# Patient Record
Sex: Male | Born: 1965 | Race: Black or African American | Hispanic: No | Marital: Married | Smoking: Current every day smoker
Health system: Southern US, Community
[De-identification: ages and names within clinical notes are randomized; demographics above are authoritative.]

## PROBLEM LIST (undated history)

## (undated) DIAGNOSIS — I429 Cardiomyopathy, unspecified: Secondary | ICD-10-CM

## (undated) DIAGNOSIS — R202 Paresthesia of skin: Secondary | ICD-10-CM

## (undated) HISTORY — DX: Cardiomyopathy, unspecified: I42.9

## (undated) HISTORY — DX: Paresthesia of skin: R20.2

---

## 2006-06-17 ENCOUNTER — Emergency Department (HOSPITAL_COMMUNITY): Admission: EM | Admit: 2006-06-17 | Discharge: 2006-06-18 | Payer: Self-pay | Admitting: Emergency Medicine

## 2015-06-02 ENCOUNTER — Emergency Department (HOSPITAL_COMMUNITY): Payer: Self-pay

## 2015-06-02 ENCOUNTER — Emergency Department (HOSPITAL_COMMUNITY)
Admission: EM | Admit: 2015-06-02 | Discharge: 2015-06-02 | Disposition: A | Discharge status: Home / Self Care | Payer: Self-pay | Attending: Emergency Medicine | Admitting: Emergency Medicine

## 2015-06-02 ENCOUNTER — Encounter (HOSPITAL_COMMUNITY): Payer: Self-pay

## 2015-06-02 DIAGNOSIS — R059 Cough, unspecified: Secondary | ICD-10-CM

## 2015-06-02 DIAGNOSIS — J40 Bronchitis, not specified as acute or chronic: Secondary | ICD-10-CM | POA: Insufficient documentation

## 2015-06-02 DIAGNOSIS — F17210 Nicotine dependence, cigarettes, uncomplicated: Secondary | ICD-10-CM | POA: Insufficient documentation

## 2015-06-02 DIAGNOSIS — R05 Cough: Secondary | ICD-10-CM

## 2015-06-02 LAB — BASIC METABOLIC PANEL
Anion gap: 15 (ref 5–15)
BUN: 10 mg/dL (ref 6–20)
CHLORIDE: 104 mmol/L (ref 101–111)
CO2: 19 mmol/L — AB (ref 22–32)
CREATININE: 0.94 mg/dL (ref 0.61–1.24)
Calcium: 9.1 mg/dL (ref 8.9–10.3)
GFR calc Af Amer: >60 mL/min (ref >60)
GFR calc non Af Amer: >60 mL/min (ref >60)
Glucose, Bld: 100 mg/dL — ABNORMAL HIGH (ref 65–99)
Potassium: 3.5 mmol/L (ref 3.5–5.1)
SODIUM: 138 mmol/L (ref 135–145)

## 2015-06-02 LAB — I-STAT TROPONIN, ED: Troponin i, poc: 0 ng/mL (ref 0.00–0.08)

## 2015-06-02 LAB — CBC
HCT: 43.9 % (ref 39.0–52.0)
Hemoglobin: 14.5 g/dL (ref 13.0–17.0)
MCH: 31.7 pg (ref 26.0–34.0)
MCHC: 33 g/dL (ref 30.0–36.0)
MCV: 95.9 fL (ref 78.0–100.0)
PLATELETS: 133 10*3/uL — AB (ref 150–400)
RBC: 4.58 MIL/uL (ref 4.22–5.81)
RDW: 13.8 % (ref 11.5–15.5)
WBC: 5.4 10*3/uL (ref 4.0–10.5)

## 2015-06-02 MED ORDER — ALBUTEROL SULFATE HFA 108 (90 BASE) MCG/ACT IN AERS
2.0000 | INHALATION_SPRAY | Freq: Once | RESPIRATORY_TRACT | Status: AC
  Administered 2015-06-02: 2 via RESPIRATORY_TRACT
  Filled 2015-06-02: qty 6.7

## 2015-06-02 NOTE — ED Provider Notes (Signed)
CSN: [id number]     Arrival date & time 06/02/15  1727 History   First MD Initiated Contact with Patient 06/02/15 2115     Chief Complaint  Patient presents with  . Cough     (Consider location/radiation/quality/duration/timing/severity/associated sxs/prior Treatment) Patient is a 50 y.o. male presenting with cough.  Cough Cough characteristics:  Productive Sputum characteristics:  Clear Severity:  Moderate Onset quality:  Gradual Duration:  3 weeks Timing:  Constant Progression:  Waxing and waning Smoker: yes   Context: upper respiratory infection   Relieved by: sitting up. Worsened by:  Lying down (feels mucus dripping on back of throat) Associated symptoms: rhinorrhea   Associated symptoms: no chest pain, no chills, no diaphoresis, no ear pain, no eye discharge, no fever, no headaches, no rash, no shortness of breath, no sinus congestion and no sore throat     History reviewed. No pertinent past medical history. History reviewed. No pertinent past surgical history. History reviewed. No pertinent family history. Social History  Substance Use Topics  . Smoking status: Current Every Day Smoker -- 3.00 packs/day    Types: Cigarettes  . Smokeless tobacco: None  . Alcohol Use: Yes     Comment: pt is unable to say how much he drinks, drinks daily - several 40's and licqor    Review of Systems  Constitutional: Negative for fever, chills, diaphoresis, appetite change and fatigue.  HENT: Positive for rhinorrhea. Negative for congestion, ear pain, facial swelling, mouth sores and sore throat.   Eyes: Negative for discharge and visual disturbance.  Respiratory: Positive for cough. Negative for chest tightness and shortness of breath.   Cardiovascular: Negative for chest pain and palpitations.  Gastrointestinal: Negative for nausea, vomiting, abdominal pain, diarrhea and blood in stool.  Endocrine: Negative for cold intolerance and heat intolerance.  Genitourinary: Negative for  frequency, decreased urine volume and difficulty urinating.  Musculoskeletal: Negative for back pain and neck stiffness.  Skin: Negative for rash.  Neurological: Negative for dizziness, weakness, light-headedness and headaches.  All other systems reviewed and are negative.     Allergies  Review of patient's allergies indicates no known allergies.  Home Medications   Prior to Admission medications   Not on File   BP 127/82 mmHg  Pulse 78  Temp(Src) 100.1 F (37.8 C) (Oral)  Resp 18  SpO2 97% Physical Exam  Constitutional: He is oriented to person, place, and time. He appears well-nourished. No distress.  HENT:  Head: Normocephalic and atraumatic.  Right Ear: External ear normal.  Left Ear: External ear normal.  Mouth/Throat: No oropharyngeal exudate, posterior oropharyngeal edema or tonsillar abscesses.  Irritation of posterior oropharynx   Eyes: Pupils are equal, round, and reactive to light. Right eye exhibits no discharge. Left eye exhibits no discharge. No scleral icterus.  Neck: Normal range of motion. Neck supple.  Cardiovascular: Normal rate.  Exam reveals no gallop and no friction rub.   No murmur heard. Pulmonary/Chest: Effort normal. No stridor. No respiratory distress. He has wheezes (mild). He has no rales. He exhibits no tenderness.  Abdominal: Soft. He exhibits no distension and no mass. There is no tenderness. There is no rebound and no guarding.  Musculoskeletal: He exhibits no edema or tenderness.  Neurological: He is alert and oriented to person, place, and time.  Skin: Skin is warm and dry. No rash noted. He is not diaphoretic. No erythema.    ED Course  Procedures (including critical care time) Labs Review Labs Reviewed  BASIC METABOLIC PANEL -  Abnormal; Notable for the following:    CO2 19 (*)    Glucose, Bld 100 (*)    All other components within normal limits  CBC - Abnormal; Notable for the following:    Platelets 133 (*)    All other  components within normal limits  I-STAT TROPOININ, ED    Imaging Review Dg Chest 2 View  06/02/2015  CLINICAL DATA:  Three week history of shortness of breath which worsens when supine, productive cough and chest congestion. Intermittent fever, and chills and diaphoresis. EXAM: CHEST  2 VIEW COMPARISON:  None. FINDINGS: Cardiac silhouette moderately enlarged. Hilar and mediastinal contours otherwise unremarkable. Prominent bronchovascular markings diffusely and mild to moderate central peribronchial thickening. No confluent airspace consolidation. No pleural effusions. Pulmonary vascularity normal. Visualized bony thorax intact. IMPRESSION: 1. Mild to moderate changes of bronchitis and/or asthma without focal airspace pneumonia. 2. Cardiomegaly without evidence of pulmonary edema. Electronically Signed   By: Hulan Saas M.D.   On: 06/02/2015 18:40   I have personally reviewed and evaluated these images and lab results as part of my medical decision-making.   EKG Interpretation   Date/Time:  Sunday June 02 2015 17:56:48 EST Ventricular Rate:  85 PR Interval:  186 QRS Duration: 100 QT Interval:  370 QTC Calculation: 440 R Axis:   69 Text Interpretation:  Normal sinus rhythm Cannot rule out Anterior infarct  , age undetermined Abnormal ECG Confirmed by Rubin Payor  MD, Harrold Donath [phone])  on 06/02/2015 9:57:13 PM      MDM   50 year old male presents with 2-3 weeks of URI symptoms. His main complaint is persistent cough when he lies down to go to sleep. He reports that the cough wakes him up intermittently at night because he feels like there is mucus do been on the back of her throat. He denies any fevers or any other physical complaints at this time.  Physical exam consistent with postnasal drip. Chest x-ray with no evidence of pneumonia.   The patient provided with albuterol for wheezing.  Patient safe for discharge with strict return precautions. Patient encouraged to establish  care with a primary care provider for follow-up. Patient also encouraged to quit smoking.   He was seen in conjunction with Dr. Rubin Payor  Final diagnoses:  Bronchitis  Cough        Drema Pry, MD 06/03/15 6045  Benjiman Core, MD 06/03/15 [phone]

## 2015-06-02 NOTE — ED Notes (Addendum)
Pt reports onset 3-4 months of cough, that is worse when he lays flat so he sleeps sitting up, voice hoarse and runny nose.  Also, c/o shortness of breath when laying flat and a tingling in left arm.   Pt has not taken any meds for symptoms or seen a doctor.  Pt able to talk in complete sentences.

## 2015-06-02 NOTE — Discharge Instructions (Signed)
Acute Bronchitis °Bronchitis is inflammation of the airways that extend from the windpipe into the lungs (bronchi). The inflammation often causes mucus to develop. This leads to a cough, which is the most common symptom of bronchitis.  °In acute bronchitis, the condition usually develops suddenly and goes away over time, usually in a couple weeks. Smoking, allergies, and asthma can make bronchitis worse. Repeated episodes of bronchitis may cause further lung problems.  °CAUSES °Acute bronchitis is most often caused by the same virus that causes a cold. The virus can spread from person to person (contagious) through coughing, sneezing, and touching contaminated objects. °SIGNS AND SYMPTOMS  °· Cough.   °· Fever.   °· Coughing up mucus.   °· Body aches.   °· Chest congestion.   °· Chills.   °· Shortness of breath.   °· Sore throat.   °DIAGNOSIS  °Acute bronchitis is usually diagnosed through a physical exam. Your health care provider will also ask you questions about your medical history. Tests, such as chest X-rays, are sometimes done to rule out other conditions.  °TREATMENT  °Acute bronchitis usually goes away in a couple weeks. Oftentimes, no medical treatment is necessary. Medicines are sometimes given for relief of fever or cough. Antibiotic medicines are usually not needed but may be prescribed in certain situations. In some cases, an inhaler may be recommended to help reduce shortness of breath and control the cough. A cool mist vaporizer may also be used to help thin bronchial secretions and make it easier to clear the chest.  °HOME CARE INSTRUCTIONS °· Get plenty of rest.   °· Drink enough fluids to keep your urine clear or pale yellow (unless you have a medical condition that requires fluid restriction). Increasing fluids may help thin your respiratory secretions (sputum) and reduce chest congestion, and it will prevent dehydration.   °· Take medicines only as directed by your health care provider. °· If  you were prescribed an antibiotic medicine, finish it all even if you start to feel better. °· Avoid smoking and secondhand smoke. Exposure to cigarette smoke or irritating chemicals will make bronchitis worse. If you are a smoker, consider using nicotine gum or skin patches to help control withdrawal symptoms. Quitting smoking will help your lungs heal faster.   °· Reduce the chances of another bout of acute bronchitis by washing your hands frequently, avoiding people with cold symptoms, and trying not to touch your hands to your mouth, nose, or eyes.   °· Keep all follow-up visits as directed by your health care provider.   °SEEK MEDICAL CARE IF: °Your symptoms do not improve after 1 week of treatment.  °SEEK IMMEDIATE MEDICAL CARE IF: °· You develop an increased fever or chills.   °· You have chest pain.   °· You have severe shortness of breath. °· You have bloody sputum.   °· You develop dehydration. °· You faint or repeatedly feel like you are going to pass out. °· You develop repeated vomiting. °· You develop a severe headache. °MAKE SURE YOU:  °· Understand these instructions. °· Will watch your condition. °· Will get help right away if you are not doing well or get worse. °  °This information is not intended to replace advice given to you by your health care provider. Make sure you discuss any questions you have with your health care provider. °  °Document Released: 05/14/2004 Document Revised: 04/27/2014 Document Reviewed: 09/27/2012 °Elsevier Interactive Patient Education ©2016 [employer] °Smoking Cessation, Tips for Success °If you are ready to quit smoking, congratulations! You have chosen to help yourself   be healthier. Cigarettes bring nicotine, tar, carbon monoxide, and other irritants into your body. Your lungs, heart, and blood vessels will be able to work better without these poisons. There are many different ways to quit smoking. Nicotine gum, nicotine patches, a nicotine inhaler, or nicotine  nasal spray can help with physical craving. Hypnosis, support groups, and medicines help break the habit of smoking. °WHAT THINGS CAN I DO TO MAKE QUITTING EASIER?  °Here are some tips to help you quit for good: °· Pick a date when you will quit smoking completely. Tell all of your friends and family about your plan to quit on that date. °· Do not try to slowly cut down on the number of cigarettes you are smoking. Pick a quit date and quit smoking completely starting on that day. °· Throw away all cigarettes.   °· Clean and remove all ashtrays from your home, work, and car. °· On a card, write down your reasons for quitting. Carry the card with you and read it when you get the urge to smoke. °· Cleanse your body of nicotine. Drink enough water and fluids to keep your urine clear or pale yellow. Do this after quitting to flush the nicotine from your body. °· Learn to predict your moods. Do not let a bad situation be your excuse to have a cigarette. Some situations in your life might tempt you into wanting a cigarette. °· Never have "just one" cigarette. It leads to wanting another and another. Remind yourself of your decision to quit. °· Change habits associated with smoking. If you smoked while driving or when feeling stressed, try other activities to replace smoking. Stand up when drinking your coffee. Brush your teeth after eating. Sit in a different chair when you read the paper. Avoid alcohol while trying to quit, and try to drink fewer caffeinated beverages. Alcohol and caffeine may urge you to smoke. °· Avoid foods and drinks that can trigger a desire to smoke, such as sugary or spicy foods and alcohol. °· Ask people who smoke not to smoke around you. °· Have something planned to do right after eating or having a cup of coffee. For example, plan to take a walk or exercise. °· Try a relaxation exercise to calm you down and decrease your stress. Remember, you may be tense and nervous for the first 2 weeks after  you quit, but this will pass. °· Find new activities to keep your hands busy. Play with a pen, coin, or rubber band. Doodle or draw things on paper. °· Brush your teeth right after eating. This will help cut down on the craving for the taste of tobacco after meals. You can also try mouthwash.   °· Use oral substitutes in place of cigarettes. Try using lemon drops, carrots, cinnamon sticks, or chewing gum. Keep them handy so they are available when you have the urge to smoke. °· When you have the urge to smoke, try deep breathing. °· Designate your home as a nonsmoking area. °· If you are a heavy smoker, ask your health care provider about a prescription for nicotine chewing gum. It can ease your withdrawal from nicotine. °· Reward yourself. Set aside the cigarette money you save and buy yourself something nice. °· Look for support from others. Join a support group or smoking cessation program. Ask someone at home or at work to help you with your plan to quit smoking. °· Always ask yourself, "Do I need this cigarette or is this just a reflex?"   Tell yourself, "Today, I choose not to smoke," or "I do not want to smoke." You are reminding yourself of your decision to quit. °· Do not replace cigarette smoking with electronic cigarettes (commonly called e-cigarettes). The safety of e-cigarettes is unknown, and some may contain harmful chemicals. °· If you relapse, do not give up! Plan ahead and think about what you will do the next time you get the urge to smoke. °HOW WILL I FEEL WHEN I QUIT SMOKING? °You may have symptoms of withdrawal because your body is used to nicotine (the addictive substance in cigarettes). You may crave cigarettes, be irritable, feel very hungry, cough often, get headaches, or have difficulty concentrating. The withdrawal symptoms are only temporary. They are strongest when you first quit but will go away within 10-14 days. When withdrawal symptoms occur, stay in control. Think about your reasons  for quitting. Remind yourself that these are signs that your body is healing and getting used to being without cigarettes. Remember that withdrawal symptoms are easier to treat than the major diseases that smoking can cause.  °Even after the withdrawal is over, expect periodic urges to smoke. However, these cravings are generally short lived and will go away whether you smoke or not. Do not smoke! °WHAT RESOURCES ARE AVAILABLE TO HELP ME QUIT SMOKING? °Your health care provider can direct you to community resources or hospitals for support, which may include: °· Group support. °· Education. °· Hypnosis. °· Therapy. °  °This information is not intended to replace advice given to you by your health care provider. Make sure you discuss any questions you have with your health care provider. °  °Document Released: 01/03/2004 Document Revised: 04/27/2014 Document Reviewed: 09/22/2012 °Elsevier Interactive Patient Education ©2016 [employer] ° °

## 2015-06-17 ENCOUNTER — Inpatient Hospital Stay: Payer: Self-pay | Admitting: Family Medicine

## 2015-07-30 ENCOUNTER — Encounter (HOSPITAL_COMMUNITY): Payer: Self-pay | Admitting: Emergency Medicine

## 2015-07-30 ENCOUNTER — Emergency Department (HOSPITAL_COMMUNITY): Payer: Self-pay

## 2015-07-30 ENCOUNTER — Observation Stay (HOSPITAL_COMMUNITY)
Admission: EM | Admit: 2015-07-30 | Discharge: 2015-07-31 | Disposition: A | Discharge status: Home / Self Care | Payer: Self-pay | Attending: Internal Medicine | Admitting: Internal Medicine

## 2015-07-30 DIAGNOSIS — Y906 Blood alcohol level of 120-199 mg/100 ml: Secondary | ICD-10-CM | POA: Insufficient documentation

## 2015-07-30 DIAGNOSIS — F1020 Alcohol dependence, uncomplicated: Secondary | ICD-10-CM | POA: Diagnosis present

## 2015-07-30 DIAGNOSIS — F17210 Nicotine dependence, cigarettes, uncomplicated: Secondary | ICD-10-CM | POA: Insufficient documentation

## 2015-07-30 DIAGNOSIS — S0003XA Contusion of scalp, initial encounter: Secondary | ICD-10-CM | POA: Insufficient documentation

## 2015-07-30 DIAGNOSIS — F10929 Alcohol use, unspecified with intoxication, unspecified: Secondary | ICD-10-CM | POA: Diagnosis present

## 2015-07-30 DIAGNOSIS — X58XXXA Exposure to other specified factors, initial encounter: Secondary | ICD-10-CM | POA: Insufficient documentation

## 2015-07-30 DIAGNOSIS — Z79899 Other long term (current) drug therapy: Secondary | ICD-10-CM | POA: Insufficient documentation

## 2015-07-30 DIAGNOSIS — Z720 Tobacco use: Secondary | ICD-10-CM | POA: Diagnosis present

## 2015-07-30 DIAGNOSIS — R55 Syncope and collapse: Principal | ICD-10-CM | POA: Diagnosis present

## 2015-07-30 DIAGNOSIS — Z9181 History of falling: Secondary | ICD-10-CM | POA: Insufficient documentation

## 2015-07-30 DIAGNOSIS — F10129 Alcohol abuse with intoxication, unspecified: Secondary | ICD-10-CM | POA: Insufficient documentation

## 2015-07-30 DIAGNOSIS — Y92008 Other place in unspecified non-institutional (private) residence as the place of occurrence of the external cause: Secondary | ICD-10-CM | POA: Insufficient documentation

## 2015-07-30 LAB — RAPID URINE DRUG SCREEN, HOSP PERFORMED
AMPHETAMINES: NOT DETECTED
BENZODIAZEPINES: NOT DETECTED
Barbiturates: NOT DETECTED
COCAINE: NOT DETECTED
OPIATES: NOT DETECTED
TETRAHYDROCANNABINOL: NOT DETECTED

## 2015-07-30 LAB — CBC
HCT: 42.4 % (ref 39.0–52.0)
Hemoglobin: 14.2 g/dL (ref 13.0–17.0)
MCH: 31.9 pg (ref 26.0–34.0)
MCHC: 33.5 g/dL (ref 30.0–36.0)
MCV: 95.3 fL (ref 78.0–100.0)
PLATELETS: 179 10*3/uL (ref 150–400)
RBC: 4.45 MIL/uL (ref 4.22–5.81)
RDW: 13.3 % (ref 11.5–15.5)
WBC: 8.4 10*3/uL (ref 4.0–10.5)

## 2015-07-30 LAB — URINALYSIS, ROUTINE W REFLEX MICROSCOPIC
Bilirubin Urine: NEGATIVE
GLUCOSE, UA: NEGATIVE mg/dL
KETONES UR: NEGATIVE mg/dL
LEUKOCYTES UA: NEGATIVE
NITRITE: NEGATIVE
PROTEIN: NEGATIVE mg/dL
Specific Gravity, Urine: 1.005 (ref 1.005–1.030)
pH: 5.5 (ref 5.0–8.0)

## 2015-07-30 LAB — BASIC METABOLIC PANEL
Anion gap: 15 (ref 5–15)
BUN: 9 mg/dL (ref 6–20)
CO2: 18 mmol/L — ABNORMAL LOW (ref 22–32)
CREATININE: 0.79 mg/dL (ref 0.61–1.24)
Calcium: 9.1 mg/dL (ref 8.9–10.3)
Chloride: 103 mmol/L (ref 101–111)
GFR calc Af Amer: >60 mL/min (ref >60)
Glucose, Bld: 102 mg/dL — ABNORMAL HIGH (ref 65–99)
Potassium: 3.6 mmol/L (ref 3.5–5.1)
SODIUM: 136 mmol/L (ref 135–145)

## 2015-07-30 LAB — CBG MONITORING, ED
GLUCOSE-CAPILLARY: 109 mg/dL — AB (ref 65–99)
Glucose-Capillary: 98 mg/dL (ref 65–99)

## 2015-07-30 LAB — PROTIME-INR
INR: 1.01 (ref 0.00–1.49)
Prothrombin Time: 13.5 seconds (ref 11.6–15.2)

## 2015-07-30 LAB — HEPATIC FUNCTION PANEL
ALK PHOS: 62 U/L (ref 38–126)
ALT: 17 U/L (ref 17–63)
AST: 23 U/L (ref 15–41)
Albumin: 4.2 g/dL (ref 3.5–5.0)
BILIRUBIN TOTAL: 0.3 mg/dL (ref 0.3–1.2)
Total Protein: 7.7 g/dL (ref 6.5–8.1)

## 2015-07-30 LAB — I-STAT TROPONIN, ED: Troponin i, poc: 0 ng/mL (ref 0.00–0.08)

## 2015-07-30 LAB — URINE MICROSCOPIC-ADD ON

## 2015-07-30 LAB — APTT: aPTT: 26 seconds (ref 24–37)

## 2015-07-30 LAB — ETHANOL: ALCOHOL ETHYL (B): 168 mg/dL — AB (ref <5)

## 2015-07-30 MED ORDER — ACETAMINOPHEN 500 MG PO TABS
1000.0000 mg | ORAL_TABLET | Freq: Once | ORAL | Status: AC
  Administered 2015-07-30: 1000 mg via ORAL
  Filled 2015-07-30: qty 2

## 2015-07-30 NOTE — ED Notes (Signed)
cbg [id number] 

## 2015-07-30 NOTE — ED Notes (Signed)
Pt used restroom but didn't get urine sample

## 2015-07-30 NOTE — Progress Notes (Signed)
EDCM spoke to patient at bedside. Patient confirms he [place] a [place]cp [place]  [facility], informed patient of services there and walk in times.  EDCM also provided patient with list of pcps who accept self pay patients, list of discount pharmacies and websites needymeds.org and GoodRX.com for medication assistance, phone number to inquire about the orange card, phone number to inquire about Medicaid, phone number to inquire about the Affordable Care Act, financial resources in the community such as local churches, [employer], urban ministries, and dental assistance for uninsured patients.  Patient thankful for resources.  No further EDCM needs at this time.  This information was provided to patient's wife at bedside.  Patient's wife reports [facility] but patient did not show up to his appointment.

## 2015-07-30 NOTE — ED Provider Notes (Signed)
CSN: [id number]     Arrival date & time 07/30/15  2138 History   First MD Initiated Contact with Patient 07/30/15 2210     Chief Complaint  Patient presents with  . Near Syncope     (Consider location/radiation/quality/duration/timing/severity/associated sxs/prior Treatment) HPI   Casey Howe is a 50 y.o. male, patient with no known past medical history, presenting to the ED with a syncopal episode that occurred around 2000 this evening. Pt found unresponsive on the front porch for at least 3 minutes. Pt refused transport by EMS. Pt was then convinced to allow his family to bring him to the hospital. States these "blackouts" have been happening randomly over the last couple months. Pt adds that he has had a cough since February, which prevents him from sleeping well. Pt states he just thinks he passed out because he is tired. Patient does not remember losing consciousness, but adamantly states that he had no symptoms prior. Patient complains of pain to the back of his head, tingling in his hands and feet, and burning in his throat. Patient states that he has no medical problems, however, states that it is been years since he has seen a physician. Patient denies fever/chills, nausea/vomiting, chest pain, shortness of breath, neck/back pain, dizziness, or any other complaints. Pt smokes 3-4 packs of cigarettes a day. Patient also endorses daily alcohol intake, but refused to give an amount. Patient's family reports the patient had at least 4-5 alcoholic beverages this evening.  History reviewed. No pertinent past medical history. History reviewed. No pertinent past surgical history. No family history on file. Social History  Substance Use Topics  . Smoking status: Current Every Day Smoker -- 3.00 packs/day    Types: Cigarettes  . Smokeless tobacco: None  . Alcohol Use: Yes     Comment: pt is unable to say how much he drinks, drinks daily - several 40's and licqor    Review of Systems   Constitutional: Negative for fever and chills.  Respiratory: Positive for cough. Negative for shortness of breath.   Cardiovascular: Negative for chest pain.  Gastrointestinal: Negative for nausea, vomiting, abdominal pain and diarrhea.  Musculoskeletal: Negative for back pain, neck pain and neck stiffness.  Skin: Negative for color change and pallor.  Neurological: Positive for syncope and headaches.       Tingling in hands and feet.  All other systems reviewed and are negative.     Allergies  Review of patient's allergies indicates no known allergies.  Home Medications   Prior to Admission medications   Medication Sig Start Date End Date Taking? Authorizing Provider  acetaminophen (TYLENOL) 500 MG tablet Take 1,000 mg by mouth every 6 (six) hours as needed for moderate pain.   Yes Historical Provider, MD  albuterol (PROVENTIL HFA;VENTOLIN HFA) 108 (90 Base) MCG/ACT inhaler Inhale 2 puffs into the lungs every 6 (six) hours as needed for wheezing or shortness of breath.   Yes Historical Provider, MD  Phenyleph-Doxyl-DM-Aspirin (ALKA-SELTZER PLUS NIGHT COLD PO) Take 2 tablets by mouth at bedtime as needed (cold symptoms).   Yes Historical Provider, MD  phenylephrine (SUDAFED PE) 10 MG TABS tablet Take 10 mg by mouth at bedtime as needed (cold symptoms).   Yes Historical Provider, MD   BP 138/81 mmHg  Pulse 73  Temp(Src) 98.6 F (37 C)  Resp 18  SpO2 90% Physical Exam  Constitutional: He is oriented to person, place, and time. He appears well-developed and well-nourished. No distress.  HENT:  Head:  Normocephalic.  Large hematoma on the back of the patient's head. No active hemorrhage.  Eyes: Conjunctivae and EOM are normal. Pupils are equal, round, and reactive to light.  Neck: Normal range of motion. Neck supple.  Cardiovascular: Normal rate, regular rhythm, normal heart sounds and intact distal pulses.   Pulmonary/Chest: Effort normal and breath sounds normal. No respiratory  distress.  Abdominal: Soft. There is no tenderness. There is no guarding.  Musculoskeletal: He exhibits no edema or tenderness.  Full ROM in all extremities and spine. No paraspinal tenderness.   Lymphadenopathy:    He has no cervical adenopathy.  Neurological: He is alert and oriented to person, place, and time. He has normal reflexes.  No sensory deficits. Strength 5/5 in all extremities. No gait disturbance. Coordination intact. Cranial nerves III-XII grossly intact. No facial droop.   Skin: Skin is warm and dry. He is not diaphoretic.  Psychiatric: He has a normal mood and affect. His behavior is normal.  Nursing note and vitals reviewed.   ED Course  Procedures (including critical care time) Labs Review Labs Reviewed  BASIC METABOLIC PANEL - Abnormal; Notable for the following:    CO2 18 (*)    Glucose, Bld 102 (*)    All other components within normal limits  URINALYSIS, ROUTINE W REFLEX MICROSCOPIC (NOT AT [facility]) - Abnormal; Notable for the following:    Hgb urine dipstick SMALL (*)    All other components within normal limits  ETHANOL - Abnormal; Notable for the following:    Alcohol, Ethyl (B) 168 (*)    All other components within normal limits  HEPATIC FUNCTION PANEL - Abnormal; Notable for the following:    Bilirubin, Direct <0.1 (*)    All other components within normal limits  URINE MICROSCOPIC-ADD ON - Abnormal; Notable for the following:    Squamous Epithelial / LPF 0-5 (*)    Bacteria, UA RARE (*)    All other components within normal limits  CBG MONITORING, ED - Abnormal; Notable for the following:    Glucose-Capillary 109 (*)    All other components within normal limits  CBC  APTT  PROTIME-INR  URINE RAPID DRUG SCREEN, HOSP PERFORMED  CBG MONITORING, ED  POCT CBG (FASTING - GLUCOSE)-MANUAL ENTRY  I-STAT TROPOININ, ED    Imaging Review Dg Chest 2 View  07/30/2015  CLINICAL DATA:  Syncope at 20:00 tonight.  Positional chest pain. EXAM: CHEST  2 VIEW  COMPARISON:  None. FINDINGS: Minimal linear opacities in the left base may be atelectatic or scarring. The lungs are otherwise clear. There is no pleural effusion. Hilar and mediastinal contours are unremarkable. Heart size is normal. IMPRESSION: Linear scarring or atelectasis in the left base. Electronically Signed   By: Ellery Plunk M.D.   On: 07/30/2015 22:51   Ct Head Wo Contrast  07/30/2015  CLINICAL DATA:  Acute onset of syncope. Acute onset of generalized weakness and headache. Posterior scalp hematoma. Concern for cervical spine injury. Initial encounter. EXAM: CT HEAD WITHOUT CONTRAST CT CERVICAL SPINE WITHOUT CONTRAST TECHNIQUE: Multidetector CT imaging of the head and cervical spine was performed following the standard protocol without intravenous contrast. Multiplanar CT image reconstructions of the cervical spine were also generated. COMPARISON:  None. FINDINGS: CT HEAD FINDINGS There is no evidence of acute infarction, mass lesion, or intra- or extra-axial hemorrhage on CT. The posterior fossa, including the cerebellum, brainstem and fourth ventricle, is within normal limits. The third and lateral ventricles, and basal ganglia are unremarkable in appearance.  The cerebral hemispheres are symmetric in appearance, with normal gray-white differentiation. No mass effect or midline shift is seen. There is no evidence of fracture; visualized osseous structures are unremarkable in appearance. The visualized portions of the orbits are within normal limits. Mucosal thickening is noted at the right maxillary sinus. The remaining paranasal sinuses and mastoid air cells are well-aerated. Soft tissue swelling is noted at the occiput. CT CERVICAL SPINE FINDINGS There is no evidence of fracture or subluxation. Vertebral bodies demonstrate normal height and alignment. A few small anterior osteophytes are noted along the mid to lower cervical spine. Intervertebral disc spaces are preserved. Prevertebral soft  tissues are within normal limits. The visualized neural foramina are grossly unremarkable. The thyroid gland is unremarkable in appearance. The visualized lung apices are clear. No significant soft tissue abnormalities are seen. IMPRESSION: 1. No evidence of traumatic intracranial injury or fracture. 2. No evidence of fracture or subluxation along the cervical spine. 3. Soft tissue swelling at the right occiput. 4. Mucosal thickening at the right maxillary sinus. Electronically Signed   By: Roanna Raider M.D.   On: 07/30/2015 23:13   Ct Cervical Spine Wo Contrast  07/30/2015  CLINICAL DATA:  Acute onset of syncope. Acute onset of generalized weakness and headache. Posterior scalp hematoma. Concern for cervical spine injury. Initial encounter. EXAM: CT HEAD WITHOUT CONTRAST CT CERVICAL SPINE WITHOUT CONTRAST TECHNIQUE: Multidetector CT imaging of the head and cervical spine was performed following the standard protocol without intravenous contrast. Multiplanar CT image reconstructions of the cervical spine were also generated. COMPARISON:  None. FINDINGS: CT HEAD FINDINGS There is no evidence of acute infarction, mass lesion, or intra- or extra-axial hemorrhage on CT. The posterior fossa, including the cerebellum, brainstem and fourth ventricle, is within normal limits. The third and lateral ventricles, and basal ganglia are unremarkable in appearance. The cerebral hemispheres are symmetric in appearance, with normal gray-white differentiation. No mass effect or midline shift is seen. There is no evidence of fracture; visualized osseous structures are unremarkable in appearance. The visualized portions of the orbits are within normal limits. Mucosal thickening is noted at the right maxillary sinus. The remaining paranasal sinuses and mastoid air cells are well-aerated. Soft tissue swelling is noted at the occiput. CT CERVICAL SPINE FINDINGS There is no evidence of fracture or subluxation. Vertebral bodies  demonstrate normal height and alignment. A few small anterior osteophytes are noted along the mid to lower cervical spine. Intervertebral disc spaces are preserved. Prevertebral soft tissues are within normal limits. The visualized neural foramina are grossly unremarkable. The thyroid gland is unremarkable in appearance. The visualized lung apices are clear. No significant soft tissue abnormalities are seen. IMPRESSION: 1. No evidence of traumatic intracranial injury or fracture. 2. No evidence of fracture or subluxation along the cervical spine. 3. Soft tissue swelling at the right occiput. 4. Mucosal thickening at the right maxillary sinus. Electronically Signed   By: Roanna Raider M.D.   On: 07/30/2015 23:13   I have personally reviewed and evaluated these images and lab results as part of my medical decision-making.   EKG Interpretation None      MDM   Final diagnoses:  Syncope, unspecified syncope type    Casey Howe presents for evaluation following a syncopal episode that occurred at around 8 PM this evening.  Findings and plan of care discussed with Casey Barrette, MD. Dr. Donnald Garre personally evaluated and examined this patient.  This patient's story and presentation is suspicious for possible cardiac event  causing syncope. He has no neuro or functional deficits upon my exam. Patient is nontoxic appearing, afebrile, not tachycardic, not tachypneic, maintains SPO2 of 97% on room air, and is in no apparent distress. Patient has no signs of sepsis or other serious or life-threatening condition. HEART score is 4, indicating moderate risk for a cardiac event. Head and C-spine free from acute abnormalities. No signs of consolidation or other acute abnormalities on chest x-ray. No acute lab abnormalities other than the ethanol level. Patient will need to be admitted for observation and syncope workup.  Filed Vitals:   07/30/15 2146 07/31/15 0016  BP: 127/86 138/81  Pulse: 99 73  Temp:  98.6 F (37 C)   Resp: 20 18  SpO2: 97% 90%     Anselm PancoastShawn C Joy, PA-C 07/31/15 0026  Casey BarretteMarcy Pfeiffer, MD 08/01/15 0010

## 2015-07-30 NOTE — ED Notes (Signed)
Patient presents from home via POV for syncopal episode lasting 3 minutes approximately 2000 this evening. Patient states he will move a certain way and have a sharp pain across the chest. Denies CP currently. C/o generalized weakness and HA with hematoma to posterior head. Denies N/V, lightheadedness, dizziness, double or blurred vision.

## 2015-07-30 NOTE — ED Notes (Signed)
Patient transported to X-ray 

## 2015-07-31 ENCOUNTER — Observation Stay (HOSPITAL_BASED_OUTPATIENT_CLINIC_OR_DEPARTMENT_OTHER): Payer: Self-pay

## 2015-07-31 ENCOUNTER — Encounter (HOSPITAL_COMMUNITY): Payer: Self-pay | Admitting: Family Medicine

## 2015-07-31 DIAGNOSIS — F10929 Alcohol use, unspecified with intoxication, unspecified: Secondary | ICD-10-CM | POA: Diagnosis present

## 2015-07-31 DIAGNOSIS — Z720 Tobacco use: Secondary | ICD-10-CM | POA: Diagnosis present

## 2015-07-31 DIAGNOSIS — F10120 Alcohol abuse with intoxication, uncomplicated: Secondary | ICD-10-CM

## 2015-07-31 DIAGNOSIS — R55 Syncope and collapse: Secondary | ICD-10-CM

## 2015-07-31 DIAGNOSIS — F1020 Alcohol dependence, uncomplicated: Secondary | ICD-10-CM

## 2015-07-31 LAB — TSH: TSH: 0.527 u[IU]/mL (ref 0.350–4.500)

## 2015-07-31 LAB — BRAIN NATRIURETIC PEPTIDE: B Natriuretic Peptide: 11.7 pg/mL (ref 0.0–100.0)

## 2015-07-31 LAB — ECHOCARDIOGRAM COMPLETE
HEIGHTINCHES: 67 in
Weight: 3065.28 oz

## 2015-07-31 LAB — GLUCOSE, CAPILLARY: GLUCOSE-CAPILLARY: 89 mg/dL (ref 65–99)

## 2015-07-31 LAB — TROPONIN I
Troponin I: <0.03 ng/mL (ref <0.031)
Troponin I: <0.03 ng/mL (ref <0.031)

## 2015-07-31 MED ORDER — POTASSIUM CHLORIDE IN NACL 20-0.9 MEQ/L-% IV SOLN
INTRAVENOUS | Status: AC
  Administered 2015-07-31: 01:00:00 via INTRAVENOUS
  Filled 2015-07-31: qty 1000

## 2015-07-31 MED ORDER — POLYETHYLENE GLYCOL 3350 17 G PO PACK: 17.0000 g | PACK | Freq: Every day | ORAL | Status: DC | PRN

## 2015-07-31 MED ORDER — ONDANSETRON HCL 4 MG/2ML IJ SOLN: 4.0000 mg | Freq: Three times a day (TID) | INTRAMUSCULAR | Status: AC | PRN

## 2015-07-31 MED ORDER — SODIUM CHLORIDE 0.9% FLUSH
3.0000 mL | Freq: Two times a day (BID) | INTRAVENOUS | Status: DC
Start: 2015-07-31 — End: 2015-07-31

## 2015-07-31 MED ORDER — LORAZEPAM 1 MG PO TABS
1.0000 mg | ORAL_TABLET | Freq: Four times a day (QID) | ORAL | Status: DC | PRN
  Administered 2015-07-31: 1 mg via ORAL
  Filled 2015-07-31 (×2): qty 1

## 2015-07-31 MED ORDER — ACETAMINOPHEN 325 MG PO TABS: 650.0000 mg | ORAL_TABLET | Freq: Four times a day (QID) | ORAL | Status: DC | PRN

## 2015-07-31 MED ORDER — BENZONATATE 100 MG PO CAPS
100.0000 mg | ORAL_CAPSULE | Freq: Once | ORAL | Status: AC
  Administered 2015-07-31: 100 mg via ORAL
  Filled 2015-07-31: qty 1

## 2015-07-31 MED ORDER — CYCLOBENZAPRINE HCL 10 MG PO TABS
10.0000 mg | ORAL_TABLET | Freq: Three times a day (TID) | ORAL | Status: DC | PRN
  Administered 2015-07-31: 10 mg via ORAL
  Filled 2015-07-31: qty 1

## 2015-07-31 MED ORDER — NICOTINE 14 MG/24HR TD PT24
14.0000 mg | MEDICATED_PATCH | Freq: Every day | TRANSDERMAL | Status: DC
  Administered 2015-07-31: 14 mg via TRANSDERMAL
  Filled 2015-07-31: qty 1

## 2015-07-31 MED ORDER — ACETAMINOPHEN 650 MG RE SUPP: 650.0000 mg | Freq: Four times a day (QID) | RECTAL | Status: DC | PRN

## 2015-07-31 MED ORDER — FOLIC ACID 1 MG PO TABS
1.0000 mg | ORAL_TABLET | Freq: Every day | ORAL | Status: DC
  Administered 2015-07-31: 1 mg via ORAL
  Filled 2015-07-31: qty 1

## 2015-07-31 MED ORDER — OXYCODONE-ACETAMINOPHEN 5-325 MG PO TABS
1.0000 | ORAL_TABLET | ORAL | Status: DC | PRN
  Administered 2015-07-31 (×2): 2 via ORAL
  Filled 2015-07-31 (×2): qty 2

## 2015-07-31 MED ORDER — HYDROCODONE-ACETAMINOPHEN 5-325 MG PO TABS
1.0000 | ORAL_TABLET | ORAL | Status: DC | PRN
  Filled 2015-07-31: qty 2

## 2015-07-31 MED ORDER — ADULT MULTIVITAMIN W/MINERALS CH
1.0000 | ORAL_TABLET | Freq: Every day | ORAL | Status: DC
  Administered 2015-07-31: 1 via ORAL
  Filled 2015-07-31: qty 1

## 2015-07-31 MED ORDER — LORAZEPAM 2 MG/ML IJ SOLN: 1.0000 mg | Freq: Four times a day (QID) | INTRAMUSCULAR | Status: DC | PRN

## 2015-07-31 MED ORDER — THIAMINE HCL 100 MG/ML IJ SOLN
100.0000 mg | Freq: Every day | INTRAMUSCULAR | Status: DC
  Filled 2015-07-31: qty 1

## 2015-07-31 MED ORDER — ENOXAPARIN SODIUM 40 MG/0.4ML ~~LOC~~ SOLN
40.0000 mg | SUBCUTANEOUS | Status: DC
  Administered 2015-07-31: 40 mg via SUBCUTANEOUS
  Filled 2015-07-31: qty 0.4

## 2015-07-31 MED ORDER — VITAMIN B-1 100 MG PO TABS
100.0000 mg | ORAL_TABLET | Freq: Every day | ORAL | Status: DC
  Administered 2015-07-31: 100 mg via ORAL
  Filled 2015-07-31: qty 1

## 2015-07-31 NOTE — Progress Notes (Signed)
*  PRELIMINARY RESULTS* Echocardiogram 2D Echocardiogram has been performed.  Casey Howe, [name] 07/31/2015, 8:56 AM

## 2015-07-31 NOTE — Progress Notes (Signed)
Discharge instructions given. Patient and family had no further questions.

## 2015-07-31 NOTE — Progress Notes (Signed)
Patient c/o bilateral neck/should pain.  Pain had analgesics which did not alleviate pain. PCP on call was notified.

## 2015-07-31 NOTE — H&P (Signed)
[facility] History and Physical  Casey Howe ZOX:[id number] DOB: [birth year] DOA: 07/30/2015  Referring physician: ED physician PCP: No primary care provider on file.  Specialists: None listed   Chief Complaint: Syncope   HPI: Casey Howe is a 50 y.o. male with PMH of chronic alcohol abuse and recurrent syncopal episodes who presents to the ED following a syncopal episode at home. Patient reports that he had been in his usual state of health throughout the day, had at least 5-6 alcoholic beverages this evening, and then woke up on his front porch with his family standing over him. Patient's family is at the bedside and assists with the history. No one saw the patient fall, but reports that he could not have been alone for more than a minute before a relative noted and laying supine on the front porch. It was reportedly ~3 minutes before the patient regained consciousness. There was no apparent incontinence or tongue bite and no convulsions were witnessed. Patient and his family report that similar episodes have occurred 3-4 times over the past 6 months. Some of the episodes have occurred in the absence of alcohol but none have been involved convulsive activity, incontinence, or tongue biting. Patient has a distant relative with epilepsy but denies any family history of sudden cardiac death or hypertrophic cardiomyopathy.  In ED, patient was found to be afebrile, saturating well on room air, and with vital signs stable. He complained of pain at the back of his head where there was a hematoma noted. CT of the head and cervical spine were obtained and, aside from soft tissue swelling at the right occiput, these studies were normal. Chest x-ray was notable for linear scarring at the left base but negative for acute cardiopulmonary disease. EKG features a sinus rhythm with PR interval of 221 ms. Ethanol level returned elevated 268. CMP is largely unremarkable, as is the CBC. INR is normal and  troponin is undetectable. Urine drug screen is negative and urinalysis is grossly negative for infection. Patient was given acetaminophen 1000 mg for his pain, remained stable in the emergency department, and will be admitted for ongoing evaluation and management of a syncopal episode.  Where does patient live?   At home    Can patient participate in ADLs?  Yes       Review of Systems:   General: no fevers, chills, sweats, weight change, poor appetite, or fatigue HEENT: no blurry vision, hearing changes or sore throat Pulm: no dyspnea, cough, or wheeze CV: no chest pain or palpitations Abd: no nausea, vomiting, abdominal pain, diarrhea, or constipation GU: no dysuria, hematuria, increased urinary frequency, or urgency  Ext: no leg edema Neuro: no focal weakness, numbness, or tingling, no vision change or hearing loss Skin: no rash, no wounds MSK: No muscle spasm, no deformity, no red, hot, or swollen joint Heme: No easy bruising or bleeding Travel history: No recent long distant travel    Allergy: No Known Allergies  History reviewed. No pertinent past medical history.  History reviewed. No pertinent past surgical history.  Social History:  reports that he has been smoking Cigarettes.  He has been smoking about 3.00 packs per day. He does not have any smokeless tobacco history on file. He reports that he drinks alcohol. He reports that he does not use illicit drugs.  Family History: History reviewed. No pertinent family history.   Prior to Admission medications   Medication Sig Start Date End Date Taking? Authorizing Provider  acetaminophen (TYLENOL)  500 MG tablet Take 1,000 mg by mouth every 6 (six) hours as needed for moderate pain.   Yes Historical Provider, MD  albuterol (PROVENTIL HFA;VENTOLIN HFA) 108 (90 Base) MCG/ACT inhaler Inhale 2 puffs into the lungs every 6 (six) hours as needed for wheezing or shortness of breath.   Yes Historical Provider, MD   Phenyleph-Doxyl-DM-Aspirin (ALKA-SELTZER PLUS NIGHT COLD PO) Take 2 tablets by mouth at bedtime as needed (cold symptoms).   Yes Historical Provider, MD  phenylephrine (SUDAFED PE) 10 MG TABS tablet Take 10 mg by mouth at bedtime as needed (cold symptoms).   Yes Historical Provider, MD    Physical Exam: Filed Vitals:   07/30/15 2146 07/31/15 0016  BP: 127/86 138/81  Pulse: 99 73  Temp: 98.6 F (37 C)   Resp: 20 18  SpO2: 97% 90%   General: Not in acute distress HEENT:       Eyes: PERRL, EOMI, no scleral icterus or conjunctival pallor.       ENT: No discharge from the ears or nose, no pharyngeal ulcers.        Neck: No JVD, no bruit, no appreciable mass Heme: No cervical adenopathy, no pallor Cardiac: S1/S2, RRR, soft systolic murmur at apex, No gallops or rubs. Pulm: Good air movement bilaterally. No rales, wheezing, rhonchi or rubs. Abd: Soft, nondistended, nontender, no rebound pain or gaurding, BS present. Ext: No LE edema bilaterally. 2+DP/PT pulse bilaterally. Musculoskeletal: No gross deformity, no red, hot, swollen joints  Skin: No rashes or wounds on exposed surfaces  Neuro: Alert, oriented X3, cranial nerves II-XII grossly intact, muscle strength 5/5 in all extremities, sensation to light touch intact. Brachial reflex 2+ bilaterally. Knee reflex 2+ bilaterally. Negative Babinski's sign. Normal finger to nose test. No focal findings Psych: Patient is not overtly psychotic, appropriate mood and affect.  Labs on Admission:  Basic Metabolic Panel:  Recent Labs Lab 07/30/15 2159  NA 136  K 3.6  CL 103  CO2 18*  GLUCOSE 102*  BUN 9  CREATININE 0.79  CALCIUM 9.1   Liver Function Tests:  Recent Labs Lab 07/30/15 2159  AST 23  ALT 17  ALKPHOS 62  BILITOT 0.3  PROT 7.7  ALBUMIN 4.2   No results for input(s): LIPASE, AMYLASE in the last 168 hours. No results for input(s): AMMONIA in the last 168 hours. CBC:  Recent Labs Lab 07/30/15 2159  WBC 8.4  HGB  14.2  HCT 42.4  MCV 95.3  PLT 179   Cardiac Enzymes: No results for input(s): CKTOTAL, CKMB, CKMBINDEX, TROPONINI in the last 168 hours.  BNP (last 3 results) No results for input(s): BNP in the last 8760 hours.  ProBNP (last 3 results) No results for input(s): PROBNP in the last 8760 hours.  CBG:  Recent Labs Lab 07/30/15 2145 07/30/15 2226  GLUCAP 109* 98    Radiological Exams on Admission: Dg Chest 2 View  07/30/2015  CLINICAL DATA:  Syncope at 20:00 tonight.  Positional chest pain. EXAM: CHEST  2 VIEW COMPARISON:  None. FINDINGS: Minimal linear opacities in the left base may be atelectatic or scarring. The lungs are otherwise clear. There is no pleural effusion. Hilar and mediastinal contours are unremarkable. Heart size is normal. IMPRESSION: Linear scarring or atelectasis in the left base. Electronically Signed   By: Ellery Plunk M.D.   On: 07/30/2015 22:51   Ct Head Wo Contrast  07/30/2015  CLINICAL DATA:  Acute onset of syncope. Acute onset of generalized weakness and headache.  Posterior scalp hematoma. Concern for cervical spine injury. Initial encounter. EXAM: CT HEAD WITHOUT CONTRAST CT CERVICAL SPINE WITHOUT CONTRAST TECHNIQUE: Multidetector CT imaging of the head and cervical spine was performed following the standard protocol without intravenous contrast. Multiplanar CT image reconstructions of the cervical spine were also generated. COMPARISON:  None. FINDINGS: CT HEAD FINDINGS There is no evidence of acute infarction, mass lesion, or intra- or extra-axial hemorrhage on CT. The posterior fossa, including the cerebellum, brainstem and fourth ventricle, is within normal limits. The third and lateral ventricles, and basal ganglia are unremarkable in appearance. The cerebral hemispheres are symmetric in appearance, with normal gray-white differentiation. No mass effect or midline shift is seen. There is no evidence of fracture; visualized osseous structures are  unremarkable in appearance. The visualized portions of the orbits are within normal limits. Mucosal thickening is noted at the right maxillary sinus. The remaining paranasal sinuses and mastoid air cells are well-aerated. Soft tissue swelling is noted at the occiput. CT CERVICAL SPINE FINDINGS There is no evidence of fracture or subluxation. Vertebral bodies demonstrate normal height and alignment. A few small anterior osteophytes are noted along the mid to lower cervical spine. Intervertebral disc spaces are preserved. Prevertebral soft tissues are within normal limits. The visualized neural foramina are grossly unremarkable. The thyroid gland is unremarkable in appearance. The visualized lung apices are clear. No significant soft tissue abnormalities are seen. IMPRESSION: 1. No evidence of traumatic intracranial injury or fracture. 2. No evidence of fracture or subluxation along the cervical spine. 3. Soft tissue swelling at the right occiput. 4. Mucosal thickening at the right maxillary sinus. Electronically Signed   By: Roanna RaiderJeffery  Chang M.D.   On: 07/30/2015 23:13   Ct Cervical Spine Wo Contrast  07/30/2015  CLINICAL DATA:  Acute onset of syncope. Acute onset of generalized weakness and headache. Posterior scalp hematoma. Concern for cervical spine injury. Initial encounter. EXAM: CT HEAD WITHOUT CONTRAST CT CERVICAL SPINE WITHOUT CONTRAST TECHNIQUE: Multidetector CT imaging of the head and cervical spine was performed following the standard protocol without intravenous contrast. Multiplanar CT image reconstructions of the cervical spine were also generated. COMPARISON:  None. FINDINGS: CT HEAD FINDINGS There is no evidence of acute infarction, mass lesion, or intra- or extra-axial hemorrhage on CT. The posterior fossa, including the cerebellum, brainstem and fourth ventricle, is within normal limits. The third and lateral ventricles, and basal ganglia are unremarkable in appearance. The cerebral hemispheres  are symmetric in appearance, with normal gray-white differentiation. No mass effect or midline shift is seen. There is no evidence of fracture; visualized osseous structures are unremarkable in appearance. The visualized portions of the orbits are within normal limits. Mucosal thickening is noted at the right maxillary sinus. The remaining paranasal sinuses and mastoid air cells are well-aerated. Soft tissue swelling is noted at the occiput. CT CERVICAL SPINE FINDINGS There is no evidence of fracture or subluxation. Vertebral bodies demonstrate normal height and alignment. A few small anterior osteophytes are noted along the mid to lower cervical spine. Intervertebral disc spaces are preserved. Prevertebral soft tissues are within normal limits. The visualized neural foramina are grossly unremarkable. The thyroid gland is unremarkable in appearance. The visualized lung apices are clear. No significant soft tissue abnormalities are seen. IMPRESSION: 1. No evidence of traumatic intracranial injury or fracture. 2. No evidence of fracture or subluxation along the cervical spine. 3. Soft tissue swelling at the right occiput. 4. Mucosal thickening at the right maxillary sinus. Electronically Signed   By: Leotis ShamesJeffery  Chang M.D.   On: 07/30/2015 23:13    EKG: Independently reviewed.  Abnormal findings:  Sinus rhythm, first degree AV block    Assessment/Plan  1. Syncope, recurrent  - No preceding sxs noted by pt  - Acute alcohol intoxication may have played a role, but has happened in absence of alcohol per family at bedside - Check orthostatics  - Monitor on telemetry  - TTE ordered  - Suspicion for seizure is low   2. Alcohol abuse with acute intoxication  - Pt endorses daily alcohol consumption in excess of 4 drinks  - Denies prior experience with withdrawal sxs  - Monitor on CIWA with prn Ativan  - Counseled regarding negative health effects of consuming >2 drinks/day    3. Tobacco abuse - Counseled  toward cessation - RN to provide smoking cessation information prior to discharge - Nicotine patch ordered upon pt request   DVT ppx:  SQ Lovenox     Code Status: Full code Family Communication:  Yes, patient's extended family at bed side Disposition Plan: Admit to inpatient   Date of Service 07/31/2015    Briscoe Deutscher, MD [facility] Pager [phone]  If 7PM-7AM, please contact night-coverage [email or web address] Password [facility] 07/31/2015, 12:46 AM

## 2015-07-31 NOTE — Discharge Summary (Signed)
Physician Discharge Summary  Casey Howe:[id number] DOB: 03/04/1966 DOA: 07/30/2015  PCP: No primary care provider on file.  Admit date: 07/30/2015 Discharge date: 07/31/2015  Time spent: 35 minutes  Recommendations for Outpatient Follow-up:  1. Patient with history of alcoholism having recurrent falls, he was counseled. Prior to discharge case manager consulted to set him up with outpatient follow-up   Discharge Diagnoses:  Principal Problem:   Syncope Active Problems:   Chronic alcoholism (HCC)   Acute alcohol intoxication (HCC)   Tobacco abuse   Discharge Condition: Stable  Diet recommendation: Regular diet  Filed Weights   07/31/15 0105  Weight: 86.9 kg (191 lb 9.3 oz)    History of present illness:  Casey Howe is a 50 y.o. male with PMH of chronic alcohol abuse and recurrent syncopal episodes who presents to the ED following a syncopal episode at home. Patient reports that he had been in his usual state of health throughout the day, had at least 5-6 alcoholic beverages this evening, and then woke up on his front porch with his family standing over him. Patient's family is at the bedside and assists with the history. No one saw the patient fall, but reports that he could not have been alone for more than a minute before a relative noted and laying supine on the front porch. It was reportedly ~3 minutes before the patient regained consciousness. There was no apparent incontinence or tongue bite and no convulsions were witnessed. Patient and his family report that similar episodes have occurred 3-4 times over the past 6 months. Some of the episodes have occurred in the absence of alcohol but none have been involved convulsive activity, incontinence, or tongue biting. Patient has a distant relative with epilepsy but denies any family history of sudden cardiac death or hypertrophic cardiomyopathy.  In ED, patient was found to be afebrile, saturating well on room air, and  with vital signs stable. He complained of pain at the back of his head where there was a hematoma noted. CT of the head and cervical spine were obtained and, aside from soft tissue swelling at the right occiput, these studies were normal. Chest x-ray was notable for linear scarring at the left base but negative for acute cardiopulmonary disease. EKG features a sinus rhythm with PR interval of 221 ms. Ethanol level returned elevated 268. CMP is largely unremarkable, as is the CBC. INR is normal and troponin is undetectable. Urine drug screen is negative and urinalysis is grossly negative for infection. Patient was given acetaminophen 1000 mg for his pain, remained stable in the emergency department, and will be admitted for ongoing evaluation and management of a syncopal episode.  Hospital Course:  [name] is a 50 year old gentleman with a past medical history of chronic alcohol use, recurrent syncopal events, admitted to the medicine service on 07/31/2015 after passing out at home. He admitted to having at least 6 beers the day of admission. Family members finding him passed out on his front porch. Labs revealed an alcohol level of 168 . On presentation he complained of neck pain. A CT scan of the neck did not reveal evidence of fracture or subglottic station of cervical spine. CT scan of brain did not show evidence of acute intracranial pathology. I think recurrent syncope likely related to his alcohol abuse. Last were otherwise unremarkable, had troponins within normal limits. BMP and CBC unremarkable. TSH of 0.527. Given clinical stability he was discharged on 07/31/2015.   Discharge Exam: Filed Vitals:  07/31/15 0105 07/31/15 0531  BP: 134/71 105/51  Pulse: 96 64  Temp: 98.7 F (37.1 C) 98.7 F (37.1 C)  Resp: 18 20    General: No acute distress, awake and alert, asking to go home Cardiovascular: Regular rate rhythm normal S1-S2 no murmurs rubs gallops Respiratory: Normal respiratory  effort lungs are clear to auscultation bilaterally Abdomen: Soft nontender nondistended Neurological: Nonfocal  Discharge Instructions   Discharge Instructions    Call MD for:  difficulty breathing, headache or visual disturbances    Complete by:  As directed      Call MD for:  extreme fatigue    Complete by:  As directed      Call MD for:  hives    Complete by:  As directed      Call MD for:  persistant dizziness or light-headedness    Complete by:  As directed      Call MD for:  persistant nausea and vomiting    Complete by:  As directed      Call MD for:  redness, tenderness, or signs of infection (pain, swelling, redness, odor or green/yellow discharge around incision site)    Complete by:  As directed      Call MD for:  severe uncontrolled pain    Complete by:  As directed      Call MD for:  temperature >100.4    Complete by:  As directed      Call MD for:    Complete by:  As directed      Diet - low sodium heart healthy    Complete by:  As directed      Increase activity slowly    Complete by:  As directed           Current Discharge Medication List    CONTINUE these medications which have NOT CHANGED   Details  acetaminophen (TYLENOL) 500 MG tablet Take 1,000 mg by mouth every 6 (six) hours as needed for moderate pain.    albuterol (PROVENTIL HFA;VENTOLIN HFA) 108 (90 Base) MCG/ACT inhaler Inhale 2 puffs into the lungs every 6 (six) hours as needed for wheezing or shortness of breath.    Phenyleph-Doxyl-DM-Aspirin (ALKA-SELTZER PLUS NIGHT COLD PO) Take 2 tablets by mouth at bedtime as needed (cold symptoms).    phenylephrine (SUDAFED PE) 10 MG TABS tablet Take 10 mg by mouth at bedtime as needed (cold symptoms).       No Known Allergies    The results of significant diagnostics from this hospitalization (including imaging, microbiology, ancillary and laboratory) are listed below for reference.    Significant Diagnostic Studies: Dg Chest 2 View  07/30/2015   CLINICAL DATA:  Syncope at 20:00 tonight.  Positional chest pain. EXAM: CHEST  2 VIEW COMPARISON:  None. FINDINGS: Minimal linear opacities in the left base may be atelectatic or scarring. The lungs are otherwise clear. There is no pleural effusion. Hilar and mediastinal contours are unremarkable. Heart size is normal. IMPRESSION: Linear scarring or atelectasis in the left base. Electronically Signed   By: Ellery Plunkaniel R Mitchell M.D.   On: 07/30/2015 22:51   Ct Head Wo Contrast  07/30/2015  CLINICAL DATA:  Acute onset of syncope. Acute onset of generalized weakness and headache. Posterior scalp hematoma. Concern for cervical spine injury. Initial encounter. EXAM: CT HEAD WITHOUT CONTRAST CT CERVICAL SPINE WITHOUT CONTRAST TECHNIQUE: Multidetector CT imaging of the head and cervical spine was performed following the standard protocol without intravenous contrast. Multiplanar CT image  reconstructions of the cervical spine were also generated. COMPARISON:  None. FINDINGS: CT HEAD FINDINGS There is no evidence of acute infarction, mass lesion, or intra- or extra-axial hemorrhage on CT. The posterior fossa, including the cerebellum, brainstem and fourth ventricle, is within normal limits. The third and lateral ventricles, and basal ganglia are unremarkable in appearance. The cerebral hemispheres are symmetric in appearance, with normal gray-white differentiation. No mass effect or midline shift is seen. There is no evidence of fracture; visualized osseous structures are unremarkable in appearance. The visualized portions of the orbits are within normal limits. Mucosal thickening is noted at the right maxillary sinus. The remaining paranasal sinuses and mastoid air cells are well-aerated. Soft tissue swelling is noted at the occiput. CT CERVICAL SPINE FINDINGS There is no evidence of fracture or subluxation. Vertebral bodies demonstrate normal height and alignment. A few small anterior osteophytes are noted along the mid  to lower cervical spine. Intervertebral disc spaces are preserved. Prevertebral soft tissues are within normal limits. The visualized neural foramina are grossly unremarkable. The thyroid gland is unremarkable in appearance. The visualized lung apices are clear. No significant soft tissue abnormalities are seen. IMPRESSION: 1. No evidence of traumatic intracranial injury or fracture. 2. No evidence of fracture or subluxation along the cervical spine. 3. Soft tissue swelling at the right occiput. 4. Mucosal thickening at the right maxillary sinus. Electronically Signed   By: Roanna Raider M.D.   On: 07/30/2015 23:13   Ct Cervical Spine Wo Contrast  07/30/2015  CLINICAL DATA:  Acute onset of syncope. Acute onset of generalized weakness and headache. Posterior scalp hematoma. Concern for cervical spine injury. Initial encounter. EXAM: CT HEAD WITHOUT CONTRAST CT CERVICAL SPINE WITHOUT CONTRAST TECHNIQUE: Multidetector CT imaging of the head and cervical spine was performed following the standard protocol without intravenous contrast. Multiplanar CT image reconstructions of the cervical spine were also generated. COMPARISON:  None. FINDINGS: CT HEAD FINDINGS There is no evidence of acute infarction, mass lesion, or intra- or extra-axial hemorrhage on CT. The posterior fossa, including the cerebellum, brainstem and fourth ventricle, is within normal limits. The third and lateral ventricles, and basal ganglia are unremarkable in appearance. The cerebral hemispheres are symmetric in appearance, with normal gray-white differentiation. No mass effect or midline shift is seen. There is no evidence of fracture; visualized osseous structures are unremarkable in appearance. The visualized portions of the orbits are within normal limits. Mucosal thickening is noted at the right maxillary sinus. The remaining paranasal sinuses and mastoid air cells are well-aerated. Soft tissue swelling is noted at the occiput. CT CERVICAL  SPINE FINDINGS There is no evidence of fracture or subluxation. Vertebral bodies demonstrate normal height and alignment. A few small anterior osteophytes are noted along the mid to lower cervical spine. Intervertebral disc spaces are preserved. Prevertebral soft tissues are within normal limits. The visualized neural foramina are grossly unremarkable. The thyroid gland is unremarkable in appearance. The visualized lung apices are clear. No significant soft tissue abnormalities are seen. IMPRESSION: 1. No evidence of traumatic intracranial injury or fracture. 2. No evidence of fracture or subluxation along the cervical spine. 3. Soft tissue swelling at the right occiput. 4. Mucosal thickening at the right maxillary sinus. Electronically Signed   By: Roanna Raider M.D.   On: 07/30/2015 23:13    Microbiology: No results found for this or any previous visit (from the past 240 hour(s)).   Labs: Basic Metabolic Panel:  Recent Labs Lab 07/30/15 2159  NA 136  K 3.6  CL 103  CO2 18*  GLUCOSE 102*  BUN 9  CREATININE 0.79  CALCIUM 9.1   Liver Function Tests:  Recent Labs Lab 07/30/15 2159  AST 23  ALT 17  ALKPHOS 62  BILITOT 0.3  PROT 7.7  ALBUMIN 4.2   No results for input(s): LIPASE, AMYLASE in the last 168 hours. No results for input(s): AMMONIA in the last 168 hours. CBC:  Recent Labs Lab 07/30/15 2159  WBC 8.4  HGB 14.2  HCT 42.4  MCV 95.3  PLT 179   Cardiac Enzymes:  Recent Labs Lab 07/30/15 2323 07/31/15 0643  TROPONINI <0.03 <0.03   BNP: BNP (last 3 results)  Recent Labs  07/30/15 2323  BNP 11.7    ProBNP (last 3 results) No results for input(s): PROBNP in the last 8760 hours.  CBG:  Recent Labs Lab 07/30/15 2145 07/30/15 2226 07/31/15 0752  GLUCAP 109* 98 89       Signed:  Jeralyn Bennett MD.  [facility] 07/31/2015, 12:33 PM

## 2017-02-21 IMAGING — DX DG CHEST 2V
2 series · 2 of 2 positions shown · non-contrast
Comparison: None.

CLINICAL DATA: Three week history of shortness of breath which
worsens when supine, productive cough and chest congestion.
Intermittent fever, and chills and diaphoresis.

EXAM:
CHEST  2 VIEW

[w chest pa]
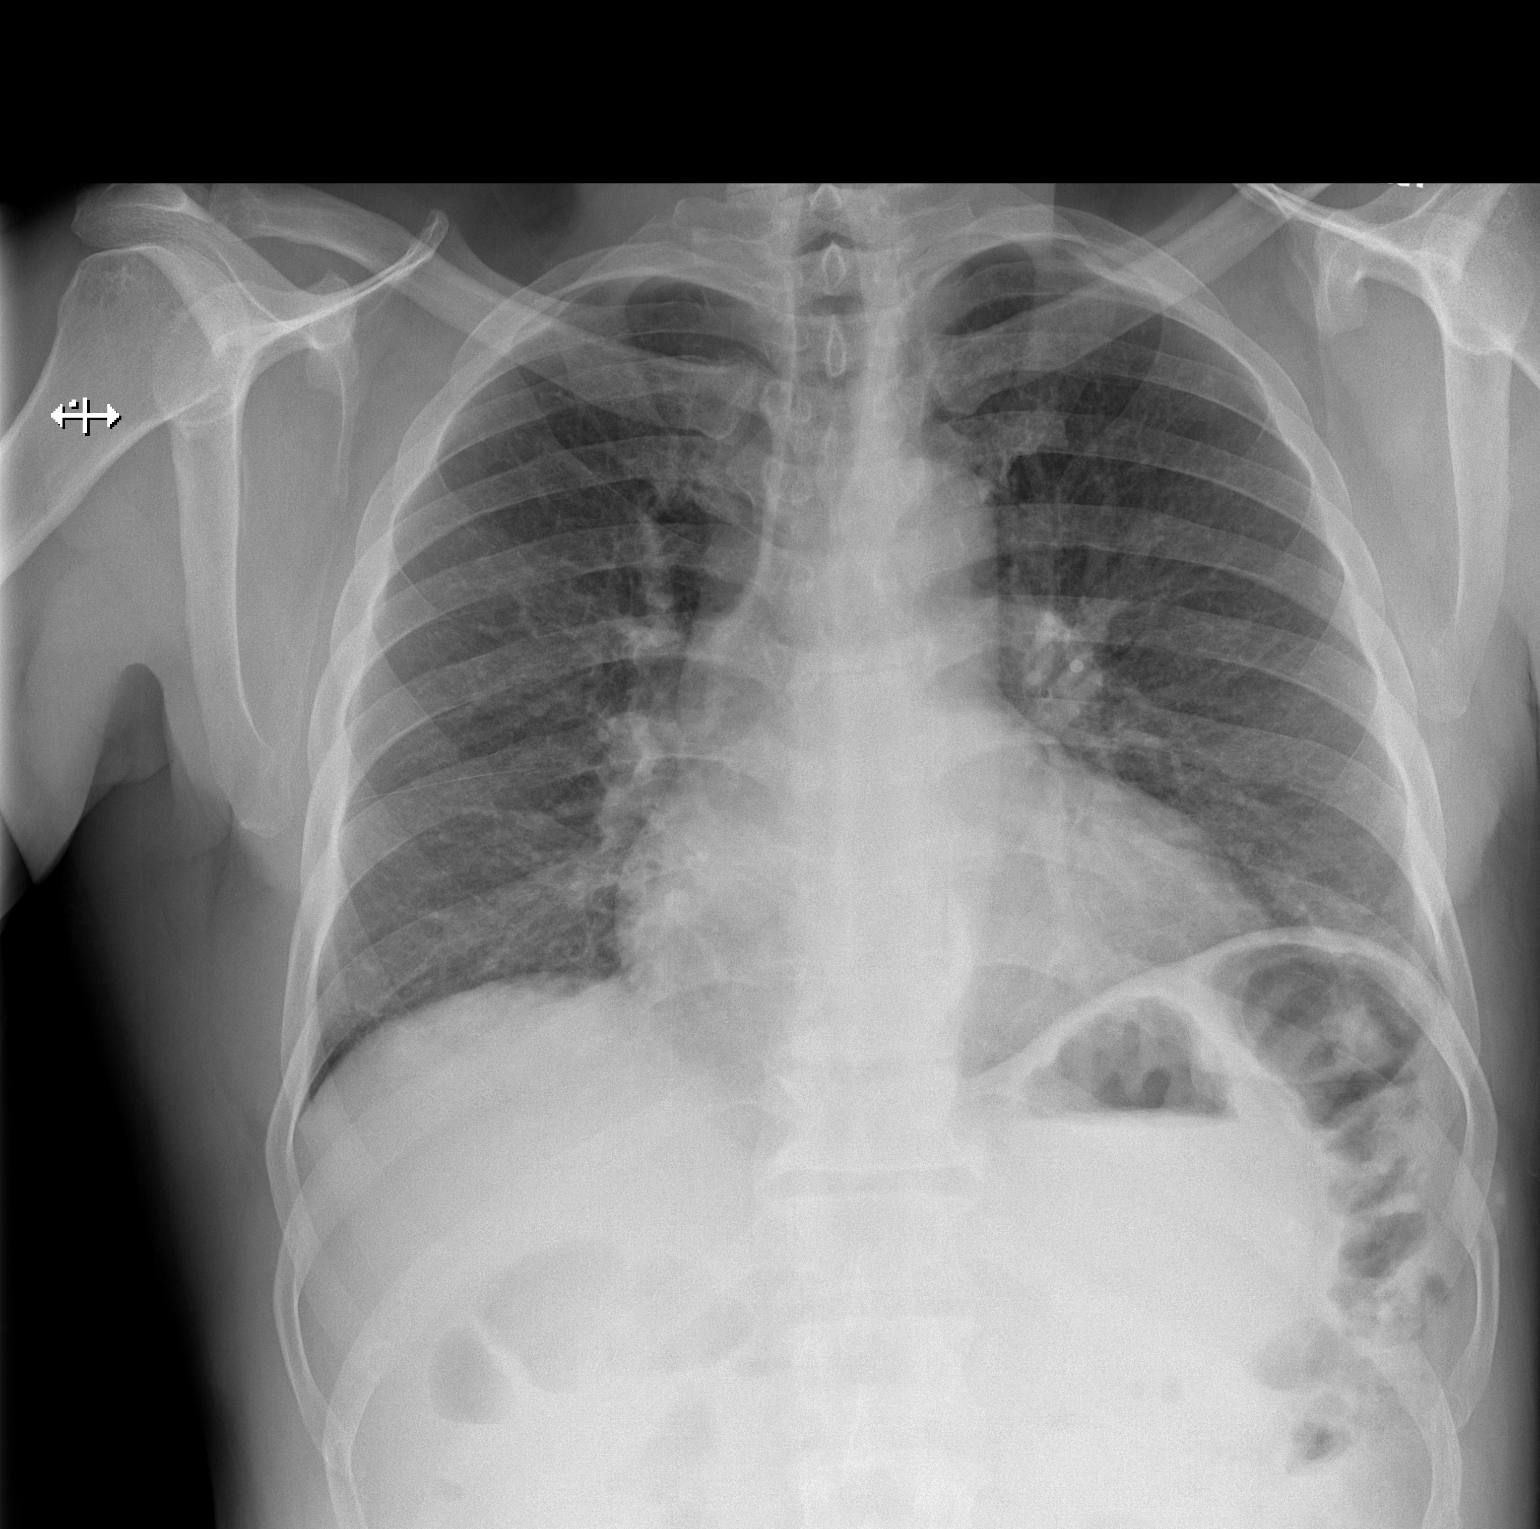

[w chest lat]
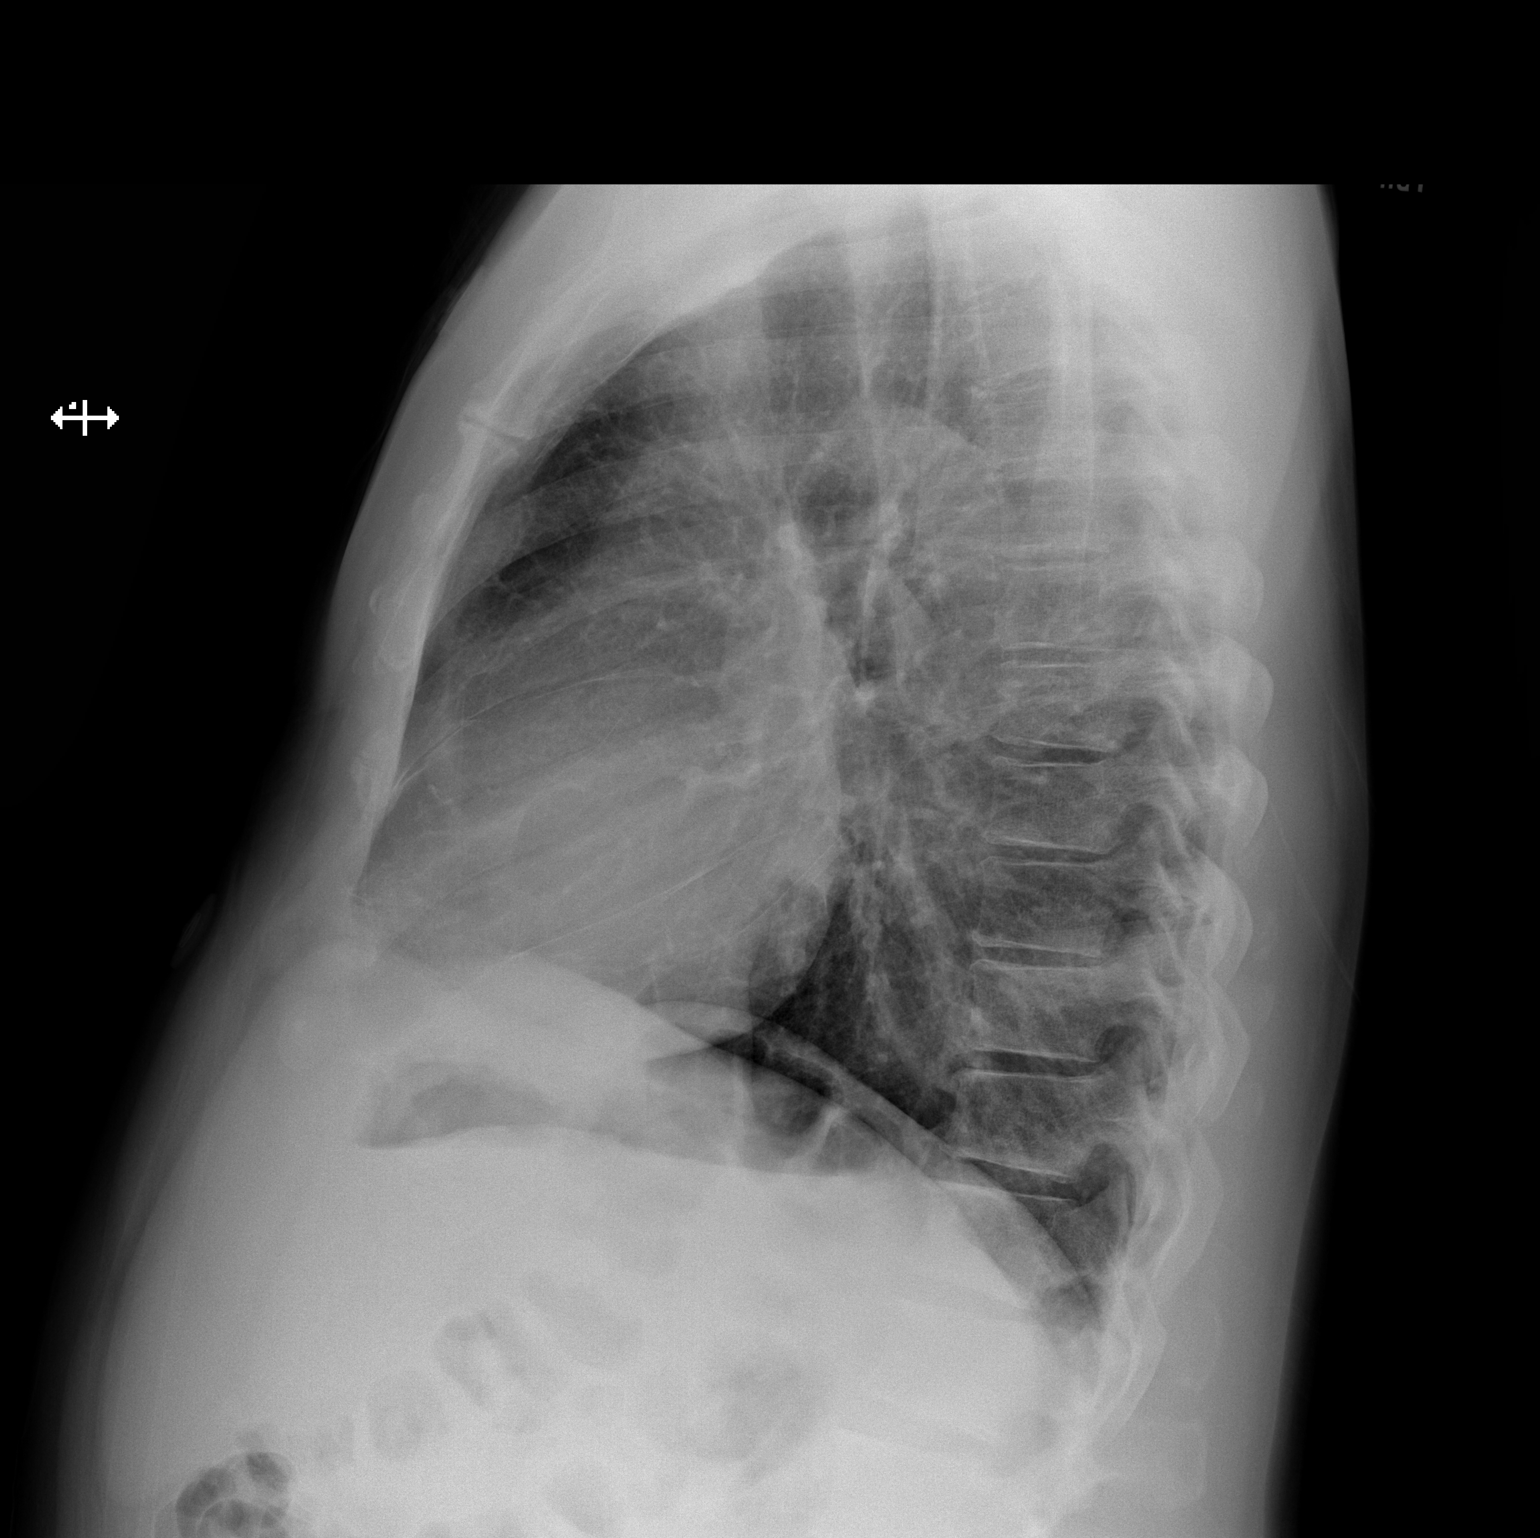

[2 of 2 positions shown; findings below may reference images not displayed]

FINDINGS: Cardiac silhouette moderately enlarged. Hilar and mediastinal
contours otherwise unremarkable. Prominent bronchovascular markings
diffusely and mild to moderate central peribronchial thickening. No
confluent airspace consolidation. No pleural effusions. Pulmonary
vascularity normal. Visualized bony thorax intact.
IMPRESSION: 1. Mild to moderate changes of bronchitis and/or asthma without
focal airspace pneumonia.
2. Cardiomegaly without evidence of pulmonary edema.

## 2019-01-26 ENCOUNTER — Other Ambulatory Visit: Payer: Self-pay

## 2019-01-26 ENCOUNTER — Emergency Department (HOSPITAL_COMMUNITY): Payer: Self-pay

## 2019-01-26 ENCOUNTER — Emergency Department (HOSPITAL_COMMUNITY)
Admission: EM | Admit: 2019-01-26 | Discharge: 2019-01-27 | Disposition: A | Discharge status: Home / Self Care | Payer: Self-pay | Attending: Emergency Medicine | Admitting: Emergency Medicine

## 2019-01-26 DIAGNOSIS — F17210 Nicotine dependence, cigarettes, uncomplicated: Secondary | ICD-10-CM | POA: Insufficient documentation

## 2019-01-26 DIAGNOSIS — U071 COVID-19: Secondary | ICD-10-CM | POA: Insufficient documentation

## 2019-01-26 MED ORDER — ACETAMINOPHEN 325 MG PO TABS
650.0000 mg | ORAL_TABLET | Freq: Once | ORAL | Status: AC | PRN
  Administered 2019-01-26: 650 mg via ORAL
  Filled 2019-01-26: qty 2

## 2019-01-26 NOTE — ED Triage Notes (Signed)
Pt states he had a COVID exposure 2 days ago. Pt temp 101.1 and reports chills and diarrhea  Denies SOB and cough.

## 2019-01-26 NOTE — ED Provider Notes (Signed)
[facility] DEPART[facility] Provider Note   CSN: [id number] Arrival date & time: 01/26/19  2124     History   Chief Complaint Chief Complaint  Patient presents with  . COVID Exposure    HPI Casey Howe is a 53 y.o. male with a past medical history significant for chronic alcoholism and tobacco abuse who presents to the ED on 10/8 due to COVID-19 exposure. Patient was around his neighbor on Tuesday who just tested positive for COVID-19. Patient admits to a sudden onset of fever, fatigue, and chills that have stayed constant since Tuesday. Patient also admits to 1 episode of non bloody diarrhea. Patient has not tried anything for his symptoms. Patient states he has had a rash that first appeared 3 months ago. Rash is associated with pruritis.Patient denies new detergents, lotions, and medications. Patient denies chest pain, shortness of breath, and cough.  No past medical history on file.  Patient Active Problem List   Diagnosis Date Noted  . Syncope 07/31/2015  . Chronic alcoholism ([place]) 07/31/2015  . Acute alcohol intoxication ([place]) 07/31/2015  . Tobacco abuse 07/31/2015    No past surgical history on file.      Home Medications    Prior to Admission medications   Medication Sig Start Date End Date Taking? Authorizing Provider  acetaminophen (TYLENOL) 500 MG tablet Take 1,000 mg by mouth every 6 (six) hours as needed for moderate pain.    [provider]  albuterol (PROVENTIL HFA;VENTOLIN HFA) 108 (90 Base) MCG/ACT inhaler Inhale 2 puffs into the lungs every 6 (six) hours as needed for wheezing or shortness of breath.    [provider]  Phenyleph-Doxyl-DM-Aspirin (ALKA-SELTZER PLUS NIGHT COLD PO) Take 2 tablets by mouth at bedtime as needed (cold symptoms).    [provider]  phenylephrine (SUDAFED PE) 10 MG TABS tablet Take 10 mg by mouth at bedtime as needed (cold symptoms).    [provider]    Family  History No family history on file.  Social History Social History   Tobacco Use  . Smoking status: Current Every Day Smoker    Packs/day: 3.00    Types: Cigarettes  Substance Use Topics  . Alcohol use: Yes    Comment: pt is unable to say how much he drinks, drinks daily - several 81'J and licqor  . Drug use: No     Allergies   Patient has no known allergies.   Review of Systems Review of Systems  Constitutional: Positive for chills, fatigue and fever.  HENT: Negative for congestion, rhinorrhea and sore throat.   Respiratory: Negative for cough and shortness of breath.   Cardiovascular: Negative for chest pain and palpitations.  Gastrointestinal: Positive for diarrhea. Negative for abdominal distention, abdominal pain, nausea and vomiting.  Skin: Positive for rash.  All other systems reviewed and are negative.    Physical Exam Updated Vital Signs BP 129/74   Pulse 80   Temp 98.6 F (37 C) (Oral)   Resp 16   SpO2 99%   Physical Exam Vitals signs and nursing note reviewed.  Constitutional:      General: He is not in acute distress.    Appearance: He is not ill-appearing.  HENT:     Head: Normocephalic.     Nose: Nose normal.     Mouth/Throat:     Mouth: Mucous membranes are moist.     Pharynx: Oropharynx is clear.  Eyes:     Conjunctiva/sclera: Conjunctivae normal.  Neck:     Musculoskeletal: Normal range of motion and neck supple.  Cardiovascular:     Rate and Rhythm: Normal rate and regular rhythm.     Pulses: Normal pulses.     Heart sounds: Normal heart sounds. No murmur. No friction rub. No gallop.   Pulmonary:     Effort: Pulmonary effort is normal.     Breath sounds: No stridor. Wheezing present. No rhonchi.  Abdominal:     General: Abdomen is flat. Bowel sounds are normal. There is no distension.     Palpations: Abdomen is soft.  Musculoskeletal: Normal range of motion.  Skin:    General: Skin is warm and dry.     Comments: Numerous, white,  scaly plaques on trunk and upper extremity. No erythema or signs of infection  Neurological:     General: No focal deficit present.     Mental Status: He is alert.      ED Treatments / Results  Labs (all labs ordered are listed, but only abnormal results are displayed) Labs Reviewed  SARS CORONAVIRUS 2 BY RT PCR (HOSPITAL ORDER, PERFORMED IN [facility]) - Abnormal; Notable for the following components:      Result Value   SARS Coronavirus 2 POSITIVE (*)    All other components within normal limits    EKG None  Radiology Dg Chest Portable 1 View  Result Date: 01/27/2019 CLINICAL DATA:  Covid exposure, febrile EXAM: PORTABLE CHEST 1 VIEW COMPARISON:  July 30, 2015 FINDINGS: The heart size and mediastinal contours are within normal limits. Both lungs are clear. The visualized skeletal structures are unremarkable. IMPRESSION: No acute cardiopulmonary process. Electronically Signed   By: Jonna Clark M.D.   On: 01/27/2019 00:04    Procedures Procedures (including critical care time)  Medications Ordered in ED Medications  acetaminophen (TYLENOL) tablet 650 mg (650 mg Oral Given 01/26/19 2230)     Initial Impression / Assessment and Plan / ED Course  I have reviewed the triage vital signs and the nursing notes.  Pertinent labs & imaging results that were available during my care of the patient were reviewed by me and considered in my medical decision making (see chart for details).  Clinical Course as of Jan 27 255  Fri Jan 27, 2019  0108 SARS Coronavirus 2 by RT PCR (hospital order, performed in [facility] hospital lab) Nasopharyngeal Nasopharyngeal Swab [CC]    Clinical Course User Index [CC] Beverely Pace, Vesta Mixer, PA-C    Casey Howe is a 53 year old male who presents to the ED due to fever and chills after being exposed to COVID-19. Patient was febrile at 101.1 at arrival, normal HR, normal RR, and not hypoxic. Patient is non-toxic appearing and in no  acute distress. On physical exam, patient has non labored breathing with a mild expiratory wheeze, but no complaints of SOB. No rales or rhonchi noted.   Will obtain chest x-ray and COVID test. Patient given Tylenol for fever while waiting. Chest x-ray with no signs of consolidation or signs of pneumonia. COVID test came back positive. Spoke to patient about how to quarantine at home and how to treat his symptoms with OTC medications. Discussed rash with patient as well and he agrees to follow-up with PCP. Strict return precautions discussed with patient who states understanding and agrees to plan. Patient discharged home in no acute distress.   Final Clinical Impressions(s) / ED Diagnoses   Final diagnoses:  COVID-19 virus infection  ED Discharge Orders    None       Lorelle FormosaCheek,  B, PA-C 01/27/19 0258    Nira Connardama, Pedro Eduardo, MD 02/02/19 [phone]

## 2019-01-27 LAB — SARS CORONAVIRUS 2 BY RT PCR (HOSPITAL ORDER, PERFORMED IN ~~LOC~~ HOSPITAL LAB): SARS Coronavirus 2: POSITIVE — AB

## 2019-01-27 NOTE — ED Notes (Signed)
Patient verbalizes understanding of discharge instructions. Opportunity for questioning and answers were provided. Armband removed by staff, pt discharged from ED ambulatory.   

## 2019-01-27 NOTE — Discharge Instructions (Addendum)
Please read through the COVID information I have provided you. Please return to the ER if you have severe shortness of breath or chest pain. You may take over the counter Tylenol for your fever. You can find more information about COVID-19 on the [facility] website. Please try and separate from your family members as best as possible as we discussed. Follow-up with your primary care doctor if your symptoms do not improve in a week. I have provided a number for a doctor if you do not have a primary care doctor. Also, please follow-up with your primary care doctor about your rash that has been present for 3 months.

## 2019-08-04 ENCOUNTER — Ambulatory Visit: Payer: Self-pay | Attending: Internal Medicine

## 2019-08-04 DIAGNOSIS — Z23 Encounter for immunization: Secondary | ICD-10-CM

## 2019-08-04 NOTE — Progress Notes (Signed)
   Covid-19 Vaccination Clinic  Name:  Casey Howe    MRN: [id number] DOB: 30-Aug-1965  08/04/2019  Mr. Game was observed post Covid-19 immunization for 15 minutes without incident. He was provided with Vaccine Information Sheet and instruction to access the V-Safe system.   [name] was instructed to call 911 with any severe reactions post vaccine: Marland Kitchen Difficulty breathing  . Swelling of face and throat  . A fast heartbeat  . A bad rash all over body  . Dizziness and weakness   Immunizations Administered    Name Date Dose VIS Date Route   Pfizer COVID-19 Vaccine 08/04/2019  1:19 PM 0.3 mL 03/31/2019 Intramuscular   Manufacturer: [employer], [employer]   Lot: [id number]   NDC: [address]-3

## 2019-08-28 ENCOUNTER — Ambulatory Visit: Payer: Self-pay | Attending: Internal Medicine

## 2019-08-28 DIAGNOSIS — Z23 Encounter for immunization: Secondary | ICD-10-CM

## 2019-08-28 NOTE — Progress Notes (Signed)
   Covid-19 Vaccination Clinic  Name:  LEAH SKORA    MRN: [id number] DOB: January 11, 1966  08/28/2019  Mr. Asmus was observed post Covid-19 immunization for 15 minutes without incident. He was provided with Vaccine Information Sheet and instruction to access the V-Safe system.   Mr. Lema was instructed to call 911 with any severe reactions post vaccine: Marland Kitchen Difficulty breathing  . Swelling of face and throat  . A fast heartbeat  . A bad rash all over body  . Dizziness and weakness   Immunizations Administered    Name Date Dose VIS Date Route   Pfizer COVID-19 Vaccine 08/28/2019  8:52 AM 0.3 mL 06/14/2018 Intramuscular   Manufacturer: [employer], [employer]   Lot: [id number]   NDC: [address]-0

## 2021-04-19 DIAGNOSIS — Z789 Other specified health status: Secondary | ICD-10-CM | POA: Insufficient documentation

## 2021-04-19 DIAGNOSIS — J90 Pleural effusion, not elsewhere classified: Secondary | ICD-10-CM | POA: Insufficient documentation

## 2021-04-20 DIAGNOSIS — I429 Cardiomyopathy, unspecified: Secondary | ICD-10-CM | POA: Insufficient documentation

## 2021-06-26 DIAGNOSIS — M25512 Pain in left shoulder: Secondary | ICD-10-CM | POA: Insufficient documentation

## 2021-06-27 ENCOUNTER — Ambulatory Visit
Admission: RE | Admit: 2021-06-27 | Discharge: 2021-06-27 | Disposition: A | Discharge status: Home / Self Care | Payer: Medicaid Other | Source: Ambulatory Visit | Attending: Physician Assistant | Admitting: Physician Assistant

## 2021-06-27 ENCOUNTER — Other Ambulatory Visit: Payer: Self-pay | Admitting: Physician Assistant

## 2021-06-27 DIAGNOSIS — M25512 Pain in left shoulder: Secondary | ICD-10-CM

## 2021-07-02 DIAGNOSIS — F419 Anxiety disorder, unspecified: Secondary | ICD-10-CM | POA: Insufficient documentation

## 2021-07-02 DIAGNOSIS — M19012 Primary osteoarthritis, left shoulder: Secondary | ICD-10-CM | POA: Insufficient documentation

## 2021-07-02 DIAGNOSIS — F32A Depression, unspecified: Secondary | ICD-10-CM | POA: Insufficient documentation

## 2021-12-17 ENCOUNTER — Ambulatory Visit: Payer: Medicaid Other | Admitting: Diagnostic Neuroimaging

## 2022-01-29 ENCOUNTER — Encounter: Payer: Self-pay | Admitting: *Deleted

## 2022-02-02 ENCOUNTER — Ambulatory Visit: Payer: Medicaid Other | Admitting: Diagnostic Neuroimaging

## 2022-02-02 ENCOUNTER — Encounter: Payer: Self-pay | Admitting: Diagnostic Neuroimaging

## 2022-03-18 ENCOUNTER — Encounter: Payer: Self-pay | Admitting: Diagnostic Neuroimaging

## 2022-03-18 ENCOUNTER — Other Ambulatory Visit: Payer: Self-pay | Admitting: Neurology

## 2022-03-18 ENCOUNTER — Ambulatory Visit (INDEPENDENT_AMBULATORY_CARE_PROVIDER_SITE_OTHER): Payer: Medicaid Other | Admitting: Diagnostic Neuroimaging

## 2022-03-18 ENCOUNTER — Telehealth: Payer: Self-pay | Admitting: Diagnostic Neuroimaging

## 2022-03-18 VITALS — BP 109/70 | HR 70 | Ht 67.0 in | Wt 177.0 lb

## 2022-03-18 DIAGNOSIS — F1020 Alcohol dependence, uncomplicated: Secondary | ICD-10-CM | POA: Diagnosis not present

## 2022-03-18 DIAGNOSIS — G621 Alcoholic polyneuropathy: Secondary | ICD-10-CM

## 2022-03-18 DIAGNOSIS — Z5900 Homelessness unspecified: Secondary | ICD-10-CM

## 2022-03-18 MED ORDER — AMITRIPTYLINE HCL 25 MG PO TABS: 25.0000 mg | ORAL_TABLET | Freq: Every day | ORAL | 3 refills | Status: AC
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
## 2022-03-18 NOTE — Telephone Encounter (Signed)
Referral for Social Work sent through [employer] to  [facility] [employer]

## 2022-03-18 NOTE — Patient Instructions (Signed)
  NUMBNESS / WEAKNESS (hands, feet; likely alcoholic neuropathy) - consider duloxetine or amitriptyline for nerve pain and depression - continue gabapentin - refer to social work

## 2022-03-19 ENCOUNTER — Other Ambulatory Visit: Payer: Self-pay | Admitting: Neurology

## 2022-03-19 DIAGNOSIS — F1020 Alcohol dependence, uncomplicated: Secondary | ICD-10-CM

## 2022-03-19 DIAGNOSIS — Z5900 Homelessness unspecified: Secondary | ICD-10-CM

## 2022-03-19 DIAGNOSIS — G621 Alcoholic polyneuropathy: Secondary | ICD-10-CM

## 2022-03-19 NOTE — Telephone Encounter (Signed)
Resending referral for Social Work to [facility] Management.Phone: [phone], Fax: [phone].  First Referral cancelled due to social worker at the [facility], therefore, referral will need to go to another Child psychotherapist.   Nurse, Baird Lyons created a new referral

## 2022-03-23 NOTE — Telephone Encounter (Signed)
Re-faxing referral to [facility]. Phone: [phone], Fax: [phone].

## 2022-03-23 NOTE — Telephone Encounter (Signed)
Received fax from [facility] Management that they cannot accept referral, fax states:   "The patient is not attributed to [facility]. The patient is not on the current member enrollment rosters for any of the [facility] risk contracted plans. Please call if you need further clarification at [phone]."  Please send referral elsewhere.

## 2022-03-25 NOTE — Telephone Encounter (Signed)
Received fax from [facility] that their social worker only sees patients who are established with them for primary care. Referral will need to be sent elsewhere.

## 2022-03-25 NOTE — Telephone Encounter (Signed)
Refax referral for Social Work to [facility]. Phone: [phone], Fax: [phone]

## 2022-03-30 ENCOUNTER — Other Ambulatory Visit: Payer: Self-pay

## 2022-03-30 ENCOUNTER — Encounter (HOSPITAL_COMMUNITY): Payer: Self-pay

## 2022-03-30 ENCOUNTER — Emergency Department (HOSPITAL_COMMUNITY)
Admission: EM | Admit: 2022-03-30 | Discharge: 2022-03-30 | Disposition: A | Discharge status: Home / Self Care | Payer: Medicaid Other | Attending: Emergency Medicine | Admitting: Emergency Medicine

## 2022-03-30 DIAGNOSIS — S91331A Puncture wound without foreign body, right foot, initial encounter: Secondary | ICD-10-CM | POA: Insufficient documentation

## 2022-03-30 DIAGNOSIS — W450XXA Nail entering through skin, initial encounter: Secondary | ICD-10-CM | POA: Diagnosis not present

## 2022-03-30 DIAGNOSIS — Z23 Encounter for immunization: Secondary | ICD-10-CM | POA: Diagnosis not present

## 2022-03-30 MED ORDER — TETANUS-DIPHTH-ACELL PERTUSSIS 5-2.5-18.5 LF-MCG/0.5 IM SUSY
0.5000 mL | PREFILLED_SYRINGE | Freq: Once | INTRAMUSCULAR | Status: AC
  Administered 2022-03-30: 0.5 mL via INTRAMUSCULAR
  Filled 2022-03-30: qty 0.5

## 2022-03-30 MED ORDER — AMOXICILLIN-POT CLAVULANATE 875-125 MG PO TABS: 1.0000 | ORAL_TABLET | Freq: Two times a day (BID) | ORAL | 0 refills | Status: DC

## 2022-03-30 NOTE — Discharge Instructions (Signed)
Return for any problem.  ?

## 2022-03-30 NOTE — ED Provider Triage Note (Signed)
Emergency Medicine Provider Triage Evaluation Note  Casey Howe , a 56 y.o. male  was evaluated in triage.  Pt complains of puncture wounds to the bottom of his right foot.  States last night when he was walking outside, he stepped on 4 nails.  He states bleeding was minimal.  Not on anticoagulation.  States he woke up this morning and his foot was quite sore.  Unknown last Tdap, states "it has been a while".  No other complaints at this time.  Review of Systems  Positive:  Negative: See above  Physical Exam  BP 116/79 (BP Location: Right Arm)   Pulse 86   Temp 98.4 F (36.9 C)   Resp 17   Ht 5\' 7"  (1.702 m)   Wt 79.4 kg   SpO2 100%   BMI 27.41 kg/m  Gen:   Awake, no distress, sitting comfortably in chair, AAOx4 Resp:  Normal effort MSK:   Moves extremities without difficulty  Other:  3 x 1-2 mm holes in the sole of the right foot with possible fourth.  No active bleeding, swelling, or erythema.  Tender to the touch.  Medical Decision Making  Medically screening exam initiated at 9:14 AM.  Appropriate orders placed.  was informed that the remainder of the evaluation will be completed by another provider, this initial triage assessment does not replace that evaluation, and the importance of remaining in the ED until their evaluation is complete.     Mylinda Latina, Cecil Cobbs 03/30/22 [phone]

## 2022-03-30 NOTE — ED Provider Notes (Signed)
[facility] EMERGENCY DEPARTMENT Provider Note   CSN: [id number] Arrival date & time: 03/30/22  0857     History  Chief Complaint  Patient presents with   Puncture Wound    Casey Howe is a 56 y.o. male.  56 year old male presents for evaluation.  Patient reports that he stepped on a nail with his right foot last night.  Patient reports puncture wound to the sole of his right foot.  Nail did penetrate through the sole of his shoe.  He is unsure of his last tetanus.  He denies fever or other complaint.  He is able to ambulate without difficulty.  The history is provided by the patient and medical records.       Home Medications Prior to Admission medications   Medication Sig Start Date End Date Taking? Authorizing Provider  amoxicillin-clavulanate (AUGMENTIN) 875-125 MG tablet Take 1 tablet by mouth every 12 (twelve) hours. 03/30/22  Yes Wynetta Fines, MD  acetaminophen (TYLENOL) 500 MG tablet Take 1,000 mg by mouth every 6 (six) hours as needed for moderate pain.    [provider]  albuterol (PROVENTIL HFA;VENTOLIN HFA) 108 (90 Base) MCG/ACT inhaler Inhale 2 puffs into the lungs every 6 (six) hours as needed for wheezing or shortness of breath.    [provider]  amitriptyline (ELAVIL) 25 MG tablet Take 1 tablet (25 mg total) by mouth at bedtime. 03/18/22   Penumalli, Glenford Bayley, MD  gabapentin (NEURONTIN) 300 MG capsule Take 300 mg by mouth 2 (two) times daily.    [provider]      Allergies    Lactose intolerance (gi)    Review of Systems   Review of Systems  All other systems reviewed and are negative.   Physical Exam Updated Vital Signs BP 116/79 (BP Location: Right Arm)   Pulse 86   Temp 98.4 F (36.9 C)   Resp 17   Ht 5\' 7"  (1.702 m)   Wt 79.4 kg   SpO2 100%   BMI 27.41 kg/m  Physical Exam Vitals and nursing note reviewed.  Constitutional:      General: He is not in acute distress.     Appearance: Normal appearance. He is well-developed.  HENT:     Head: Normocephalic and atraumatic.  Eyes:     Conjunctiva/sclera: Conjunctivae normal.     Pupils: Pupils are equal, round, and reactive to light.  Cardiovascular:     Rate and Rhythm: Normal rate and regular rhythm.     Heart sounds: Normal heart sounds.  Pulmonary:     Effort: Pulmonary effort is normal. No respiratory distress.     Breath sounds: Normal breath sounds.  Abdominal:     General: There is no distension.     Palpations: Abdomen is soft.     Tenderness: There is no abdominal tenderness.  Musculoskeletal:        General: No deformity. Normal range of motion.     Cervical back: Normal range of motion and neck supple.     Comments: Small, superficial puncture wound noted to the sole of his right foot.  No surrounding erythema or significant edema.  Patient with normal gait and no significant antalgia.  Skin:    General: Skin is warm and dry.  Neurological:     General: No focal deficit present.     Mental Status: He is alert and oriented to person, place, and time.     ED Results / Procedures /  Treatments   Labs (all labs ordered are listed, but only abnormal results are displayed) Labs Reviewed - No data to display  EKG None  Radiology No results found.  Procedures Procedures    Medications Ordered in ED Medications  Tdap (BOOSTRIX) injection 0.5 mL (0.5 mLs Intramuscular Given 03/30/22 1203)    ED Course/ Medical Decision Making/ A&P                           Medical Decision Making Risk Prescription drug management.    Medical Screen Complete  This patient presented to the ED with complaint of puncture wound to right foot.  This complaint involves an extensive number of treatment options. The initial differential diagnosis includes, but is not limited to,, trauma related to puncture, secondary infection  This presentation is: Acute, Self-Limited, Previously Undiagnosed,  Uncertain Prognosis, and Complicated  Patient is presenting with puncture wound to right foot after stepping on a nail.  Tetanus updated.    Patient without signs of significant infection at this time.  Will treat with course of Augmentin.   Patient offered x-ray of his foot to further evaluate for possible retained foreign body.  He declined same.  Importance of close follow-up was stressed.  Strict return precautions given and understood.   Additional history obtained:  External records from outside sources obtained and reviewed including prior ED visits and prior Inpatient records.   Medicines ordered:  I ordered medication including tetanus prophylaxis for prophylaxis Reevaluation of the patient after these medicines showed that the patient: stayed the same  Problem List / ED Course:  Puncture wound, right foot   Reevaluation:  After the interventions noted above, I reevaluated the patient and found that they have: stayed the same   Disposition:  After consideration of the diagnostic results and the patients response to treatment, I feel that the patent would benefit from close outpatient follow-up.          Final Clinical Impression(s) / ED Diagnoses Final diagnoses:  Puncture wound of right foot, initial encounter    Rx / DC Orders ED Discharge Orders          Ordered    amoxicillin-clavulanate (AUGMENTIN) 875-125 MG tablet  Every 12 hours        03/30/22 1156              Wynetta Fines, MD 03/30/22 1353

## 2022-03-30 NOTE — ED Triage Notes (Signed)
Pt arrived POV from home c/o stepping on a nail with his right foot last night. Pt states it did not get stuck in his foot and bleeding was minimal.

## 2022-03-30 NOTE — Telephone Encounter (Signed)
Referral resent to [facility]. Phone: [phone], Fax: [phone].  Received a call from [facility] do not take referral if not a patient there.

## 2022-04-21 ENCOUNTER — Ambulatory Visit (INDEPENDENT_AMBULATORY_CARE_PROVIDER_SITE_OTHER): Payer: Medicaid Other | Admitting: Podiatry

## 2022-04-21 DIAGNOSIS — B351 Tinea unguium: Secondary | ICD-10-CM

## 2022-04-21 DIAGNOSIS — S91134S Puncture wound without foreign body of right lesser toe(s) without damage to nail, sequela: Secondary | ICD-10-CM

## 2022-04-21 DIAGNOSIS — B353 Tinea pedis: Secondary | ICD-10-CM

## 2022-04-21 MED ORDER — CICLOPIROX 8 % EX SOLN: Freq: Every day | CUTANEOUS | 0 refills | Status: AC

## 2022-04-21 MED ORDER — KETOCONAZOLE 2 % EX CREA: 1.0000 | TOPICAL_CREAM | Freq: Every day | CUTANEOUS | 2 refills | Status: AC

## 2022-04-21 NOTE — Progress Notes (Signed)
  Subjective:  Patient ID: TAE VONADA, male    DOB: [birth year],  MRN: [id number]  Chief Complaint  Patient presents with   Toe Injury    Puncture wound of right foot - stepped on a couple of nails, 2 spots look callused over    57 y.o. male presents with the above complaint. History confirmed with patient.  He also notes moisture between the toes on the right foot and discoloring of his nails  Objective:  Physical Exam: warm, good capillary refill, no trophic changes or ulcerative lesions, normal DP and PT pulses, normal sensory exam, onychomycosis, tinea pedis, and well-healed puncture wounds right distal forefoot, callus tissue, there are no signs of infection.  Assessment:   1. Onychomycosis   2. Tinea pedis of both feet   3. Puncture wound of fifth toe of right foot, sequela      Plan:  Patient was evaluated and treated and all questions answered.  Discussed the etiology and treatment options for tinea pedis and onychomycosis.  Discussed topical and oral treatment.  Recommended topical treatment with 2% ketoconazole cream and Penlac for the nails.  He would not be a good oral candidate due to his alcohol use..  This was sent to the patient's pharmacy.  Also discussed appropriate foot hygiene, use of antifungal spray such as Tinactin in shoes, as well as cleaning foot surfaces such as showers and bathroom floors with bleach.  Adding the puncture wounds they are well-healed and no signs of infection are noted.  He will return as needed for this  Return if symptoms worsen or fail to improve.

## 2022-04-29 ENCOUNTER — Other Ambulatory Visit: Payer: Self-pay | Admitting: Diagnostic Neuroimaging

## 2022-04-29 ENCOUNTER — Telehealth: Payer: Self-pay | Admitting: Diagnostic Neuroimaging

## 2022-04-29 DIAGNOSIS — R200 Anesthesia of skin: Secondary | ICD-10-CM

## 2022-04-29 DIAGNOSIS — S0990XS Unspecified injury of head, sequela: Secondary | ICD-10-CM

## 2022-04-29 NOTE — Progress Notes (Signed)
Patient here with his son for son's appt. Mentions h/o of head trauma, and ongoing numbness in body. Will check CT head.  Orders Placed This Encounter  Procedures   CT HEAD WO CONTRAST (5MM)    Penni Bombard, MD 4/46/2863, 81:77 PM Certified in Neurology, Neurophysiology and Neuroimaging  [facility] [address] [place], [place] [address] [phone]

## 2022-04-29 NOTE — Telephone Encounter (Signed)
[facility]: [id number] exp. 04/29/22-06/27/22 sent to GI [phone]

## 2022-05-21 ENCOUNTER — Emergency Department (HOSPITAL_COMMUNITY)
Admission: EM | Admit: 2022-05-21 | Discharge: 2022-05-21 | Disposition: A | Discharge status: Home / Self Care | Payer: Medicaid Other | Attending: Emergency Medicine | Admitting: Emergency Medicine

## 2022-05-21 ENCOUNTER — Emergency Department (HOSPITAL_COMMUNITY): Payer: Medicaid Other

## 2022-05-21 ENCOUNTER — Encounter (HOSPITAL_COMMUNITY): Payer: Self-pay

## 2022-05-21 ENCOUNTER — Other Ambulatory Visit: Payer: Self-pay

## 2022-05-21 DIAGNOSIS — F17200 Nicotine dependence, unspecified, uncomplicated: Secondary | ICD-10-CM | POA: Insufficient documentation

## 2022-05-21 DIAGNOSIS — R059 Cough, unspecified: Secondary | ICD-10-CM | POA: Diagnosis not present

## 2022-05-21 DIAGNOSIS — R0602 Shortness of breath: Secondary | ICD-10-CM | POA: Insufficient documentation

## 2022-05-21 DIAGNOSIS — Z20822 Contact with and (suspected) exposure to covid-19: Secondary | ICD-10-CM | POA: Insufficient documentation

## 2022-05-21 DIAGNOSIS — R079 Chest pain, unspecified: Secondary | ICD-10-CM | POA: Diagnosis present

## 2022-05-21 DIAGNOSIS — J189 Pneumonia, unspecified organism: Secondary | ICD-10-CM

## 2022-05-21 LAB — CBC WITH DIFFERENTIAL/PLATELET
Abs Immature Granulocytes: 0.02 10*3/uL (ref 0.00–0.07)
Basophils Absolute: 0 10*3/uL (ref 0.0–0.1)
Basophils Relative: 1 %
Eosinophils Absolute: 0.1 10*3/uL (ref 0.0–0.5)
Eosinophils Relative: 4 %
HCT: 42.5 % (ref 39.0–52.0)
Hemoglobin: 14.4 g/dL (ref 13.0–17.0)
Immature Granulocytes: 1 %
Lymphocytes Relative: 50 %
Lymphs Abs: 2 10*3/uL (ref 0.7–4.0)
MCH: 33.2 pg (ref 26.0–34.0)
MCHC: 33.9 g/dL (ref 30.0–36.0)
MCV: 97.9 fL (ref 80.0–100.0)
Monocytes Absolute: 0.4 10*3/uL (ref 0.1–1.0)
Monocytes Relative: 10 %
Neutro Abs: 1.3 10*3/uL — ABNORMAL LOW (ref 1.7–7.7)
Neutrophils Relative %: 34 %
Platelets: 163 10*3/uL (ref 150–400)
RBC: 4.34 MIL/uL (ref 4.22–5.81)
RDW: 13 % (ref 11.5–15.5)
WBC: 3.8 10*3/uL — ABNORMAL LOW (ref 4.0–10.5)
nRBC: 0 % (ref 0.0–0.2)

## 2022-05-21 LAB — I-STAT CHEM 8, ED
BUN: 11 mg/dL (ref 6–20)
Calcium, Ion: 1.14 mmol/L — ABNORMAL LOW (ref 1.15–1.40)
Chloride: 106 mmol/L (ref 98–111)
Creatinine, Ser: 0.8 mg/dL (ref 0.61–1.24)
Glucose, Bld: 81 mg/dL (ref 70–99)
HCT: 43 % (ref 39.0–52.0)
Hemoglobin: 14.6 g/dL (ref 13.0–17.0)
Potassium: 4 mmol/L (ref 3.5–5.1)
Sodium: 139 mmol/L (ref 135–145)
TCO2: 22 mmol/L (ref 22–32)

## 2022-05-21 LAB — TROPONIN I (HIGH SENSITIVITY)
Troponin I (High Sensitivity): 3 ng/L (ref <18)
Troponin I (High Sensitivity): 2 ng/L (ref <18)

## 2022-05-21 LAB — D-DIMER, QUANTITATIVE: D-Dimer, Quant: 1.25 ug/mL-FEU — ABNORMAL HIGH (ref 0.00–0.50)

## 2022-05-21 LAB — RESP PANEL BY RT-PCR (RSV, FLU A&B, COVID)  RVPGX2
Influenza A by PCR: NEGATIVE
Influenza B by PCR: NEGATIVE
Resp Syncytial Virus by PCR: NEGATIVE
SARS Coronavirus 2 by RT PCR: NEGATIVE

## 2022-05-21 MED ORDER — AMOXICILLIN-POT CLAVULANATE 875-125 MG PO TABS
1.0000 | ORAL_TABLET | Freq: Once | ORAL | Status: AC
  Administered 2022-05-21: 1 via ORAL
  Filled 2022-05-21: qty 1

## 2022-05-21 MED ORDER — LIDOCAINE 5 % EX PTCH
1.0000 | MEDICATED_PATCH | Freq: Once | CUTANEOUS | Status: AC
  Administered 2022-05-21: 1 via TRANSDERMAL
  Filled 2022-05-21: qty 1

## 2022-05-21 MED ORDER — AMOXICILLIN-POT CLAVULANATE 875-125 MG PO TABS: 1.0000 | ORAL_TABLET | Freq: Two times a day (BID) | ORAL | 0 refills | Status: DC

## 2022-05-21 MED ORDER — DOXYCYCLINE HYCLATE 100 MG PO CAPS: 100.0000 mg | ORAL_CAPSULE | Freq: Two times a day (BID) | ORAL | 0 refills | Status: AC

## 2022-05-21 MED ORDER — DOXYCYCLINE HYCLATE 100 MG PO TABS
100.0000 mg | ORAL_TABLET | Freq: Once | ORAL | Status: AC
  Administered 2022-05-21: 100 mg via ORAL
  Filled 2022-05-21: qty 1

## 2022-05-21 MED ORDER — IOHEXOL 350 MG/ML SOLN
75.0000 mL | Freq: Once | INTRAVENOUS | Status: AC | PRN
  Administered 2022-05-21: 75 mL via INTRAVENOUS

## 2022-05-21 MED ORDER — DOXYCYCLINE HYCLATE 100 MG PO CAPS: 100.0000 mg | ORAL_CAPSULE | Freq: Two times a day (BID) | ORAL | 0 refills | Status: DC

## 2022-05-21 NOTE — ED Notes (Signed)
Attempted IV with no success, consulting other RN to attempt

## 2022-05-21 NOTE — ED Triage Notes (Signed)
Pt came from home for complaints of right sided rib pain starting a day ago. States it hurts worse when taking a deep breath. Pain level of 6. Aox4.

## 2022-05-21 NOTE — ED Provider Notes (Signed)
[facility] Provider Note   CSN: [id number] Arrival date & time: 05/21/22  5329     History  Chief Complaint  Patient presents with   Chest Pain    Casey Howe is a 57 y.o. male.  Casey Howe is a 57 y.o. male with a history of cardiomyopathy, who presents to the ED for evaluation of right-sided chest pain.  He reports pain started yesterday.  He denies any injury or trauma.  He reports pain is worse when he takes a deep breath.  Pain is also worse with coughing and certain movements.  He reports some mild shortness of breath with this.  Cough has been nonproductive, no hemoptysis, no fevers or chills or known sick contacts.  No lower extremity swelling.  Denies history of similar pain.  Does report chronic cough related to smoking, still smokes 1 pack/day.  The history is provided by the patient and a relative.  Chest Pain Associated symptoms: cough and shortness of breath   Associated symptoms: no abdominal pain, no fever, no nausea and no vomiting        Home Medications Prior to Admission medications   Medication Sig Start Date End Date Taking? Authorizing Provider  acetaminophen (TYLENOL) 500 MG tablet Take 1,000 mg by mouth every 6 (six) hours as needed for moderate pain.    [provider]  albuterol (PROVENTIL HFA;VENTOLIN HFA) 108 (90 Base) MCG/ACT inhaler Inhale 2 puffs into the lungs every 6 (six) hours as needed for wheezing or shortness of breath.    [provider]  amitriptyline (ELAVIL) 25 MG tablet Take 1 tablet (25 mg total) by mouth at bedtime. 03/18/22   Penumalli, Earlean Polka, MD  amoxicillin (AMOXIL) 500 MG capsule Take 500 mg by mouth every 8 (eight) hours. 03/18/22   [provider]  ciclopirox (PENLAC) 8 % solution Apply topically at bedtime. Apply over nail and surrounding skin. Apply daily over previous coat. After seven (7) days, may remove with alcohol and continue cycle. 04/21/22    McDonald, Stephan Minister, DPM  gabapentin (NEURONTIN) 300 MG capsule Take 300 mg by mouth 2 (two) times daily.    [provider]  ibuprofen (ADVIL) 800 MG tablet Take 800 mg by mouth every 6 (six) hours as needed. 03/18/22   [provider]  ketoconazole (NIZORAL) 2 % cream Apply 1 Application topically daily. 04/21/22   McDonald, Stephan Minister, DPM  Vitamin D, Ergocalciferol, (DRISDOL) 1.25 MG (50000 UNIT) CAPS capsule Take by mouth. 04/10/22   [provider]      Allergies    Lactose intolerance (gi)    Review of Systems   Review of Systems  Constitutional:  Negative for chills and fever.  HENT: Negative.    Respiratory:  Positive for cough and shortness of breath.   Cardiovascular:  Positive for chest pain. Negative for leg swelling.  Gastrointestinal:  Negative for abdominal pain, nausea and vomiting.  All other systems reviewed and are negative.   Physical Exam Updated Vital Signs BP 117/84   Pulse 88   Temp 98.4 F (36.9 C) (Oral)   Resp (!) 22   Ht 5\' 7"  (1.702 m)   Wt 77.1 kg   SpO2 99%   BMI 26.63 kg/m  Physical Exam Vitals and nursing note reviewed.  Constitutional:      General: He is not in acute distress.    Appearance: Normal appearance. He is well-developed. He is not diaphoretic.  HENT:     Head: Normocephalic and atraumatic.  Eyes:     General:        Right eye: No discharge.        Left eye: No discharge.     Pupils: Pupils are equal, round, and reactive to light.  Cardiovascular:     Rate and Rhythm: Normal rate and regular rhythm.     Pulses: Normal pulses.          Radial pulses are 2+ on the right side and 2+ on the left side.     Heart sounds: Normal heart sounds.  Pulmonary:     Effort: Pulmonary effort is normal. No respiratory distress.     Breath sounds: Normal breath sounds. No wheezing or rales.     Comments: Respirations equal and unlabored, patient able to speak in full sentences, lungs clear to auscultation bilaterally   Chest:     Chest wall: Tenderness present.     Comments: Mild tenderness over the right lower chest without overlying skin changes or palpable deformity Abdominal:     General: Bowel sounds are normal. There is no distension.     Palpations: Abdomen is soft. There is no mass.     Tenderness: There is no abdominal tenderness. There is no guarding.     Comments: Abdomen soft, nondistended, nontender to palpation in all quadrants without guarding or peritoneal signs  Musculoskeletal:        General: No deformity.     Cervical back: Neck supple.     Right lower leg: No tenderness. No edema.     Left lower leg: No tenderness. No edema.  Skin:    General: Skin is warm and dry.     Capillary Refill: Capillary refill takes less than 2 seconds.  Neurological:     Mental Status: He is alert and oriented to person, place, and time.     Coordination: Coordination normal.     Comments: Speech is clear, able to follow commands Moves extremities without ataxia, coordination intact  Psychiatric:        Mood and Affect: Mood normal.        Behavior: Behavior normal.     ED Results / Procedures / Treatments   Labs (all labs ordered are listed, but only abnormal results are displayed) Labs Reviewed  CBC WITH DIFFERENTIAL/PLATELET - Abnormal; Notable for the following components:      Result Value   WBC 3.8 (*)    Neutro Abs 1.3 (*)    All other components within normal limits  D-DIMER, QUANTITATIVE - Abnormal; Notable for the following components:   D-Dimer, Quant 1.25 (*)    All other components within normal limits  RESP PANEL BY RT-PCR (RSV, FLU A&B, COVID)  RVPGX2  BASIC METABOLIC PANEL  I-STAT CHEM 8, ED  TROPONIN I (HIGH SENSITIVITY)  TROPONIN I (HIGH SENSITIVITY)    EKG None  Radiology DG Chest 2 View  Result Date: 05/21/2022 CLINICAL DATA:  Chest pain. EXAM: CHEST - 2 VIEW COMPARISON:  01/26/2019. FINDINGS: Clear lungs. Normal heart size and mediastinal contours. No pleural  effusion or pneumothorax. Visualized bones and upper abdomen are unremarkable. IMPRESSION: No evidence of acute cardiopulmonary disease. Electronically Signed   By: Emmit Alexanders M.D.   On: 05/21/2022 10:12    Procedures Procedures    Medications Ordered in ED Medications  lidocaine (LIDODERM) 5 % 1 patch (1 patch Transdermal Patch Applied 05/21/22 1022)    ED Course/ Medical Decision Making/  A&P                             Medical Decision Making Amount and/or Complexity of Data Reviewed Labs: ordered. Radiology: ordered.  Risk Prescription drug management.   Patient presents to the emergency department with chest pain. Patient nontoxic appearing, in no apparent distress, vitals without significant abnormality. Fairly benign physical exam.   DDX including but not limited to: ACS, pulmonary embolism, dissection, pneumothorax, pneumonia, arrhythmia, severe anemia, MSK, GERD, anxiety, abdominal process.   Additional history obtained:  Chart & nursing note reviewed.   EKG: NSR without ischemic changes  Lab Tests:  I reviewed & interpreted labs including:  Mild leukopenia, hemoglobin normal, troponin negative x 2 D-dimer is mildly elevated at 1.25, respiratory viral panel is negative for COVID, flu and RSV.  BMP pending, delayed due to recollection multiple times by lab  Imaging Studies ordered:  I ordered and viewed and interpreted imaging, chest x-ray without acute cardiopulmonary disease.  Given elevated D-dimer CT angio PE study ordered and pending at shift change.  ED Course:  I ordered medications including Lidoderm patch for chest wall pain   Chest pain does not sound typical of cardiac type pain.  EKG without obvious acute ischemia, delta troponin negative, doubt ACS.  Patient cannot be PERC negative due to age and has pleuritic pain, D-dimer ordered and elevated.  Will proceed with CTA to rule out PE.  Pain is not a tearing sensation, symmetric pulses, no widening of  mediastinum on CXR, doubt dissection. Cardiac monitor reviewed, no notable arrhythmias or tachycardia   At shift change care signed out to Dr. De Burrs who will follow-up on pending metabolic panel and CTA PE study.  If PE study is negative, pain likely from chest wall and should be treated with Lidoderm and/or anti-inflammatories and anticipate discharge home.  Portions of this note were generated with [employer]. Dictation errors may occur despite best attempts at proofreading.         Final Clinical Impression(s) / ED Diagnoses Final diagnoses:  Right-sided chest pain    Rx / DC Orders ED Discharge Orders     None         Janet Berlin 05/21/22 1603    Teressa Lower, MD 05/22/22 [phone]

## 2022-05-21 NOTE — ED Notes (Signed)
2 ED phlebotomists attempted blood draw sticks with no success. Reattempting with new phlebotomist at 80

## 2022-05-29 ENCOUNTER — Other Ambulatory Visit: Payer: Medicaid Other

## 2022-06-03 ENCOUNTER — Other Ambulatory Visit: Payer: Medicaid Other

## 2022-06-03 ENCOUNTER — Other Ambulatory Visit (HOSPITAL_COMMUNITY): Payer: Self-pay

## 2022-06-25 ENCOUNTER — Ambulatory Visit: Payer: Medicaid Other | Admitting: Orthopaedic Surgery

## 2022-08-18 ENCOUNTER — Other Ambulatory Visit (INDEPENDENT_AMBULATORY_CARE_PROVIDER_SITE_OTHER): Payer: Medicaid Other

## 2022-08-18 ENCOUNTER — Encounter: Payer: Self-pay | Admitting: Orthopaedic Surgery

## 2022-08-18 ENCOUNTER — Ambulatory Visit (INDEPENDENT_AMBULATORY_CARE_PROVIDER_SITE_OTHER): Payer: Medicaid Other | Admitting: Orthopaedic Surgery

## 2022-08-18 DIAGNOSIS — M25562 Pain in left knee: Secondary | ICD-10-CM

## 2022-08-18 DIAGNOSIS — G8929 Other chronic pain: Secondary | ICD-10-CM

## 2022-08-18 DIAGNOSIS — M25512 Pain in left shoulder: Secondary | ICD-10-CM | POA: Diagnosis not present

## 2022-08-18 MED ORDER — CELECOXIB 200 MG PO CAPS: 200.0000 mg | ORAL_CAPSULE | Freq: Two times a day (BID) | ORAL | 3 refills | Status: AC

## 2022-08-18 NOTE — Progress Notes (Signed)
Office Visit Note   Patient: Casey Howe           Date of Birth: 1966-02-01           MRN: [id number] Visit Date: 08/18/2022              Requested by: Lewanda Rife, PA [address] [place],  [place] [address] PCP: Lewanda Rife, [place]   Assessment & Plan: Visit Diagnoses:  1. Chronic left shoulder pain   2. Chronic pain of left knee     Plan: Impression is chronic left knee and left shoulder pain.  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  He is not interested in steroid injections at this time.  For the shoulder difficult to say if this is osteoarthritis versus partial rotator cuff tear.  Function seems to be preserved.  He would like to try Celebrex for both conditions for now.  Will see him back as needed.  Follow-Up Instructions: No follow-ups on file.   Orders:  Orders Placed This Encounter  Procedures   XR Shoulder Left   XR KNEE 3 VIEW LEFT   Meds ordered this encounter  Medications   celecoxib (CELEBREX) 200 MG capsule    Sig: Take 1 capsule (200 mg total) by mouth 2 (two) times daily.    Dispense:  30 capsule    Refill:  3      Procedures: No procedures performed   Clinical Data: No additional findings.   Subjective: Chief Complaint  Patient presents with   Left Shoulder - Pain   Left Knee - Pain    HPI  Casey Howe is a 57 year old male here for evaluation of left shoulder and left knee pain.  He has had shoulder pain for years that is getting worse.  Has tingling down the arm and decreased range of motion.  For the left knee has had pain for about a year.  Not had any surgeries or injections or physical therapy.  Review of Systems  Constitutional: Negative.   HENT: Negative.    Eyes: Negative.   Respiratory: Negative.    Cardiovascular: Negative.   Gastrointestinal: Negative.   Endocrine: Negative.   Genitourinary: Negative.   Skin: Negative.   Allergic/Immunologic: Negative.   Neurological: Negative.   Hematological: Negative.    Psychiatric/Behavioral: Negative.    All other systems reviewed and are negative.    Objective: Vital Signs: There were no vitals taken for this visit.  Physical Exam Vitals and nursing note reviewed.  Constitutional:      Appearance: He is well-developed.  HENT:     Head: Normocephalic and atraumatic.  Eyes:     Pupils: Pupils are equal, round, and reactive to light.  Pulmonary:     Effort: Pulmonary effort is normal.  Abdominal:     Palpations: Abdomen is soft.  Musculoskeletal:        General: Normal range of motion.     Cervical back: Neck supple.  Skin:    General: Skin is warm.  Neurological:     Mental Status: He is alert and oriented to person, place, and time.  Psychiatric:        Behavior: Behavior normal.        Thought Content: Thought content normal.        Judgment: Judgment normal.     Ortho Exam  Examination left knee shows small effusion.  Medial joint line tenderness.  Pain and crepitus with range of motion.  Collaterals and  cruciates are stable.  Examination left shoulder shows mild decreased range of motion secondary to slight guarding.  Manual muscle testing the rotator cuff is grossly normal with some slight guarding.  Specialty Comments:  No specialty comments available.  Imaging: XR Shoulder Left  Result Date: 08/18/2022 Significant osteoarthritis of the acromioclavicular joint.  Otherwise no acute abnormalities.  XR KNEE 3 VIEW LEFT  Result Date: 08/18/2022 Advanced tricompartmental degenerative joint disease.  Bone-on-bone joint space narrowing.    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 No pertinent family history.  History reviewed. No pertinent surgical history. Social History   Occupational History   Not on file  Tobacco Use   Smoking status: Every Day    Packs/day: 1    Types: Cigarettes   Smokeless tobacco: Not on file  Substance and Sexual Activity   Alcohol use: Yes    Comment: pt is unable to say how much he drinks, drinks daily - several 40's and licqor   Drug use: No   Sexual activity: Not on file

## 2023-01-18 ENCOUNTER — Emergency Department (HOSPITAL_COMMUNITY)
Admission: EM | Admit: 2023-01-18 | Discharge: 2023-01-18 | Disposition: A | Discharge status: Home / Self Care | Payer: Medicaid Other | Attending: Emergency Medicine | Admitting: Emergency Medicine

## 2023-01-18 ENCOUNTER — Encounter (HOSPITAL_COMMUNITY): Payer: Self-pay

## 2023-01-18 ENCOUNTER — Other Ambulatory Visit: Payer: Self-pay

## 2023-01-18 ENCOUNTER — Emergency Department (HOSPITAL_COMMUNITY): Payer: Medicaid Other

## 2023-01-18 DIAGNOSIS — F17200 Nicotine dependence, unspecified, uncomplicated: Secondary | ICD-10-CM | POA: Diagnosis not present

## 2023-01-18 DIAGNOSIS — Z20822 Contact with and (suspected) exposure to covid-19: Secondary | ICD-10-CM | POA: Insufficient documentation

## 2023-01-18 DIAGNOSIS — R051 Acute cough: Secondary | ICD-10-CM | POA: Diagnosis not present

## 2023-01-18 DIAGNOSIS — R059 Cough, unspecified: Secondary | ICD-10-CM | POA: Diagnosis present

## 2023-01-18 DIAGNOSIS — R0602 Shortness of breath: Secondary | ICD-10-CM | POA: Insufficient documentation

## 2023-01-18 LAB — RESP PANEL BY RT-PCR (RSV, FLU A&B, COVID)  RVPGX2
Influenza A by PCR: NEGATIVE
Influenza B by PCR: NEGATIVE
Resp Syncytial Virus by PCR: NEGATIVE
SARS Coronavirus 2 by RT PCR: NEGATIVE

## 2023-01-18 LAB — COMPREHENSIVE METABOLIC PANEL
ALT: 24 U/L (ref 0–44)
AST: 49 U/L — ABNORMAL HIGH (ref 15–41)
Albumin: 3.9 g/dL (ref 3.5–5.0)
Alkaline Phosphatase: 64 U/L (ref 38–126)
Anion gap: 17 — ABNORMAL HIGH (ref 5–15)
BUN: 7 mg/dL (ref 6–20)
CO2: 20 mmol/L — ABNORMAL LOW (ref 22–32)
Calcium: 9.1 mg/dL (ref 8.9–10.3)
Chloride: 99 mmol/L (ref 98–111)
Creatinine, Ser: 1 mg/dL (ref 0.61–1.24)
GFR, Estimated: >60 mL/min (ref >60)
Glucose, Bld: 86 mg/dL (ref 70–99)
Potassium: 4 mmol/L (ref 3.5–5.1)
Sodium: 136 mmol/L (ref 135–145)
Total Bilirubin: 0.4 mg/dL (ref 0.3–1.2)
Total Protein: 7.4 g/dL (ref 6.5–8.1)

## 2023-01-18 LAB — CBC
HCT: 44.6 % (ref 39.0–52.0)
Hemoglobin: 14.3 g/dL (ref 13.0–17.0)
MCH: 32.9 pg (ref 26.0–34.0)
MCHC: 32.1 g/dL (ref 30.0–36.0)
MCV: 102.5 fL — ABNORMAL HIGH (ref 80.0–100.0)
Platelets: 162 10*3/uL (ref 150–400)
RBC: 4.35 MIL/uL (ref 4.22–5.81)
RDW: 13.5 % (ref 11.5–15.5)
WBC: 5.2 10*3/uL (ref 4.0–10.5)
nRBC: 0 % (ref 0.0–0.2)

## 2023-01-18 MED ORDER — BENZONATATE 100 MG PO CAPS: 100.0000 mg | ORAL_CAPSULE | Freq: Three times a day (TID) | ORAL | 0 refills | Status: AC | PRN

## 2023-01-18 MED ORDER — VENTOLIN HFA 108 (90 BASE) MCG/ACT IN AERS: 1.0000 | INHALATION_SPRAY | Freq: Four times a day (QID) | RESPIRATORY_TRACT | 0 refills | Status: AC | PRN

## 2023-01-18 NOTE — ED Provider Notes (Signed)
[place] EMERGENCY DEPARTMENT AT [facility] Provider Note   CSN: [id number] Arrival date & time: 01/18/23  4132     History  Chief Complaint  Patient presents with   Cough    Casey Howe is a 57 y.o. male with medical history of cardiomyopathy, alcohol use, tobacco use, pleural effusion.  Patient presents to ED for evaluation of cough, shortness of breath.  States that this is been ongoing for the last 1 month.  States that it is worse at night when he tries to lay down.  Denies any recent fevers, nausea vomiting, lightheadedness, dizziness, weakness.  Denies leg swelling or chest pain.  States he does smoke cigarettes.  He reports the cough is productive.   Cough Associated symptoms: shortness of breath   Associated symptoms: no chest pain and no fever        Home Medications Prior to Admission medications   Medication Sig Start Date End Date Taking? Authorizing Provider  albuterol (VENTOLIN HFA) 108 (90 Base) MCG/ACT inhaler Inhale 1 puff into the lungs every 6 (six) hours as needed for wheezing or shortness of breath. 01/18/23  Yes Al Decant, PA-C  benzonatate (TESSALON) 100 MG capsule Take 1 capsule (100 mg total) by mouth every 8 (eight) hours as needed for cough. 01/18/23  Yes Al Decant, PA-C  acetaminophen (TYLENOL) 500 MG tablet Take 1,000 mg by mouth every 6 (six) hours as needed for moderate pain.    [provider]  amitriptyline (ELAVIL) 25 MG tablet Take 1 tablet (25 mg total) by mouth at bedtime. 03/18/22   Penumalli, Glenford Bayley, MD  amoxicillin-clavulanate (AUGMENTIN) 875-125 MG tablet Take 1 tablet by mouth every 12 (twelve) hours. 05/21/22   Kommor, Madison, MD  celecoxib (CELEBREX) 200 MG capsule Take 1 capsule (200 mg total) by mouth 2 (two) times daily. 08/18/22   Tarry Kos, MD  ciclopirox (PENLAC) 8 % solution Apply topically at bedtime. Apply over nail and surrounding skin. Apply daily over previous coat. After seven  (7) days, may remove with alcohol and continue cycle. 04/21/22   McDonald, Rachelle Hora, DPM  doxycycline (VIBRAMYCIN) 100 MG capsule Take 1 capsule (100 mg total) by mouth 2 (two) times daily. 05/21/22   Kommor, Madison, MD  gabapentin (NEURONTIN) 300 MG capsule Take 300 mg by mouth 2 (two) times daily.    [provider]  ibuprofen (ADVIL) 800 MG tablet Take 800 mg by mouth every 6 (six) hours as needed. 03/18/22   [provider]  ketoconazole (NIZORAL) 2 % cream Apply 1 Application topically daily. 04/21/22   McDonald, Rachelle Hora, DPM  Vitamin D, Ergocalciferol, (DRISDOL) 1.25 MG (50000 UNIT) CAPS capsule Take by mouth. 04/10/22   [provider]      Allergies    Lactose intolerance (gi)    Review of Systems   Review of Systems  Constitutional:  Negative for fever.  Respiratory:  Positive for cough and shortness of breath.   Cardiovascular:  Negative for chest pain.  Neurological:  Negative for dizziness, weakness and light-headedness.  All other systems reviewed and are negative.   Physical Exam Updated Vital Signs BP 103/64 (BP Location: Right Arm)   Pulse (!) 54   Temp 98.3 F (36.8 C)   Resp 17   Ht 5\' 7"  (1.702 m)   Wt 77.1 kg   SpO2 98%   BMI 26.62 kg/m  Physical Exam  ED Results / Procedures / Treatments   Labs (all  labs ordered are listed, but only abnormal results are displayed) Labs Reviewed  COMPREHENSIVE METABOLIC PANEL - Abnormal; Notable for the following components:      Result Value   CO2 20 (*)    AST 49 (*)    Anion gap 17 (*)    All other components within normal limits  CBC - Abnormal; Notable for the following components:   MCV 102.5 (*)    All other components within normal limits  RESP PANEL BY RT-PCR (RSV, FLU A&B, COVID)  RVPGX2    EKG None  Radiology DG Chest 1 View  Result Date: 01/18/2023 CLINICAL DATA:  Cough. EXAM: CHEST  1 VIEW COMPARISON:  05/21/2022. FINDINGS: Bilateral lung fields are clear. Bilateral lateral  costophrenic angles are clear. Normal cardio-mediastinal silhouette. No acute osseous abnormalities. The soft tissues are within normal limits. IMPRESSION: 1. No active disease. Electronically Signed   By: Jules Schick M.D.   On: 01/18/2023 11:58    Procedures Procedures   Medications Ordered in ED Medications - No data to display  ED Course/ Medical Decision Making/ A&P  Medical Decision Making Amount and/or Complexity of Data Reviewed Labs: ordered. Radiology: ordered.   57 year old male presents to the ED for evaluation.  Please see HPI for further details.  On examination the patient is afebrile, nontachycardic.  His lung sounds are clear bilaterally and he is not hypoxic.  His abdomen is soft and compressible throughout.  His neurological examination is at baseline without focal neurodeficits.  He has no edema to his bilateral lower extremities.  He is overall nontoxic in appearance.  He talks in full sentences.  Patient CBC without leukocytosis or anemia.  Metabolic panel unremarkable.  Viral panel negative for all.  Chest x-ray unremarkable without active disease.  EKG is nonischemic.  At this time, patient workup reassuring.  Patient will be advised to follow-up with his PCP regarding his cough and shortness of breath.  Will be sent home with albuterol inhaler and Tessalon Perles.  Advised to follow back up with the ED with any new or worsening symptoms.  Discharged in stable condition.   Final Clinical Impression(s) / ED Diagnoses Final diagnoses:  Acute cough  Shortness of breath    Rx / DC Orders ED Discharge Orders          Ordered    benzonatate (TESSALON) 100 MG capsule  Every 8 hours PRN        01/18/23 1207    albuterol (VENTOLIN HFA) 108 (90 Base) MCG/ACT inhaler  Every 6 hours PRN        01/18/23 1207              Al Decant, PA-C 01/18/23 1208    Melene Plan, DO 01/18/23 1241

## 2023-01-18 NOTE — Discharge Instructions (Signed)
This a pleasure taking part in your care today.  As discussed, your workup is reassuring.  No evidence of pneumonia on chest x-ray or fluid on your lungs.  Please been taking Tessalon Perles as prescribed.  Please also begin utilizing albuterol inhaler as prescribed.  Please follow-up with your PCP for further management and care.  Return to the ED with any new symptoms.

## 2023-01-18 NOTE — ED Triage Notes (Addendum)
Pt c/o productive cough w/clear mucous and SOBx3-25mos. Pt is eupneic.

## 2023-03-19 IMAGING — CR DG SHOULDER 2+V*L*
3 series · 3 of 3 positions shown · non-contrast
Comparison: None.

CLINICAL DATA: Left shoulder pain for 6 months.  No injury.

EXAM:
LEFT SHOULDER - 2+ VIEW

[w shoulder grashey left]
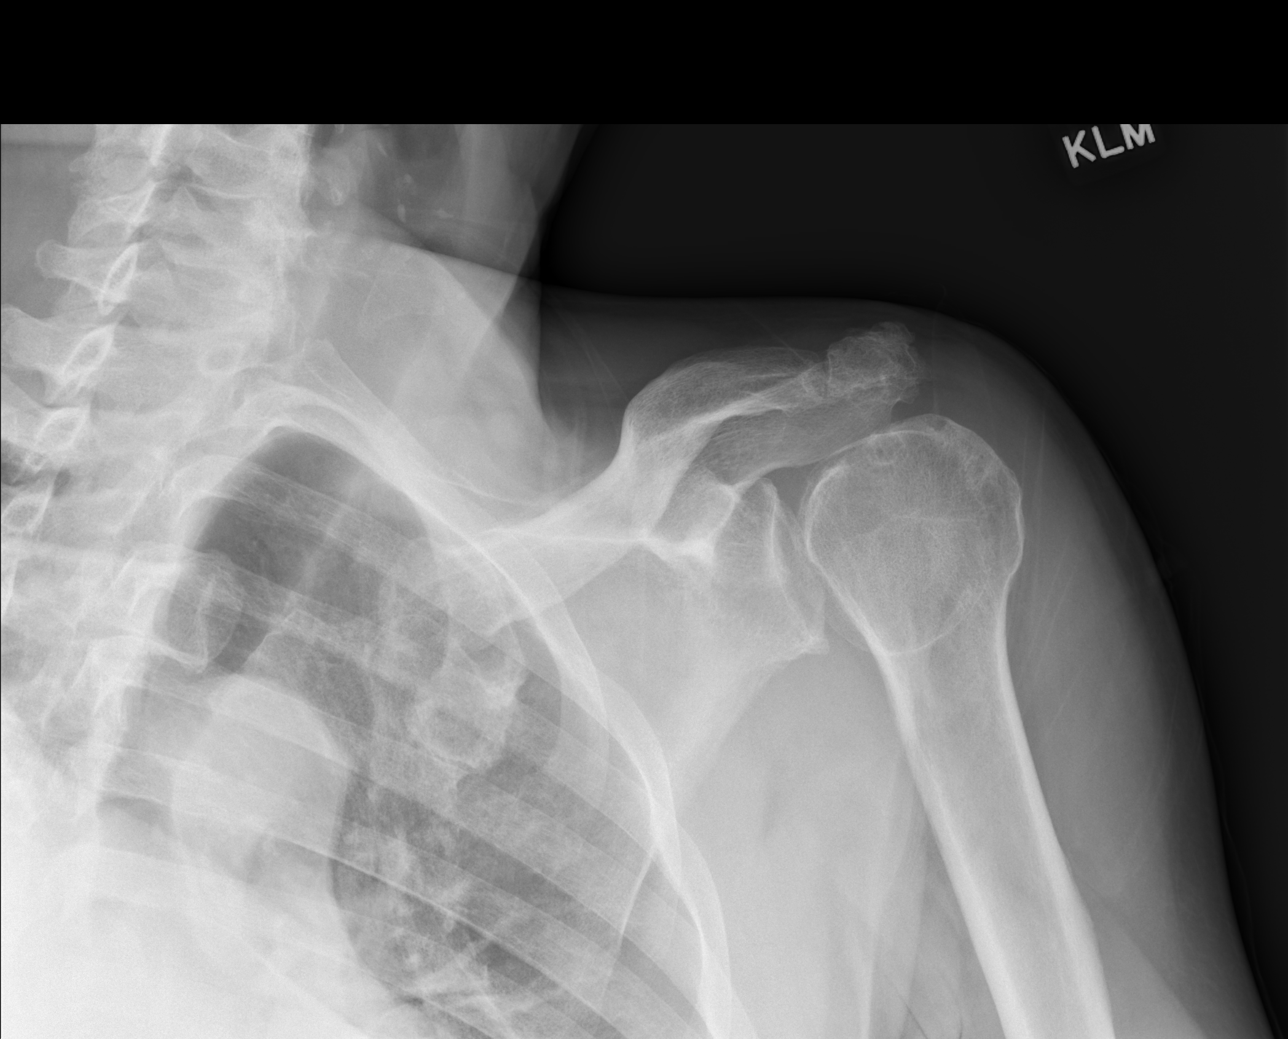

[w shoulder y-view left]
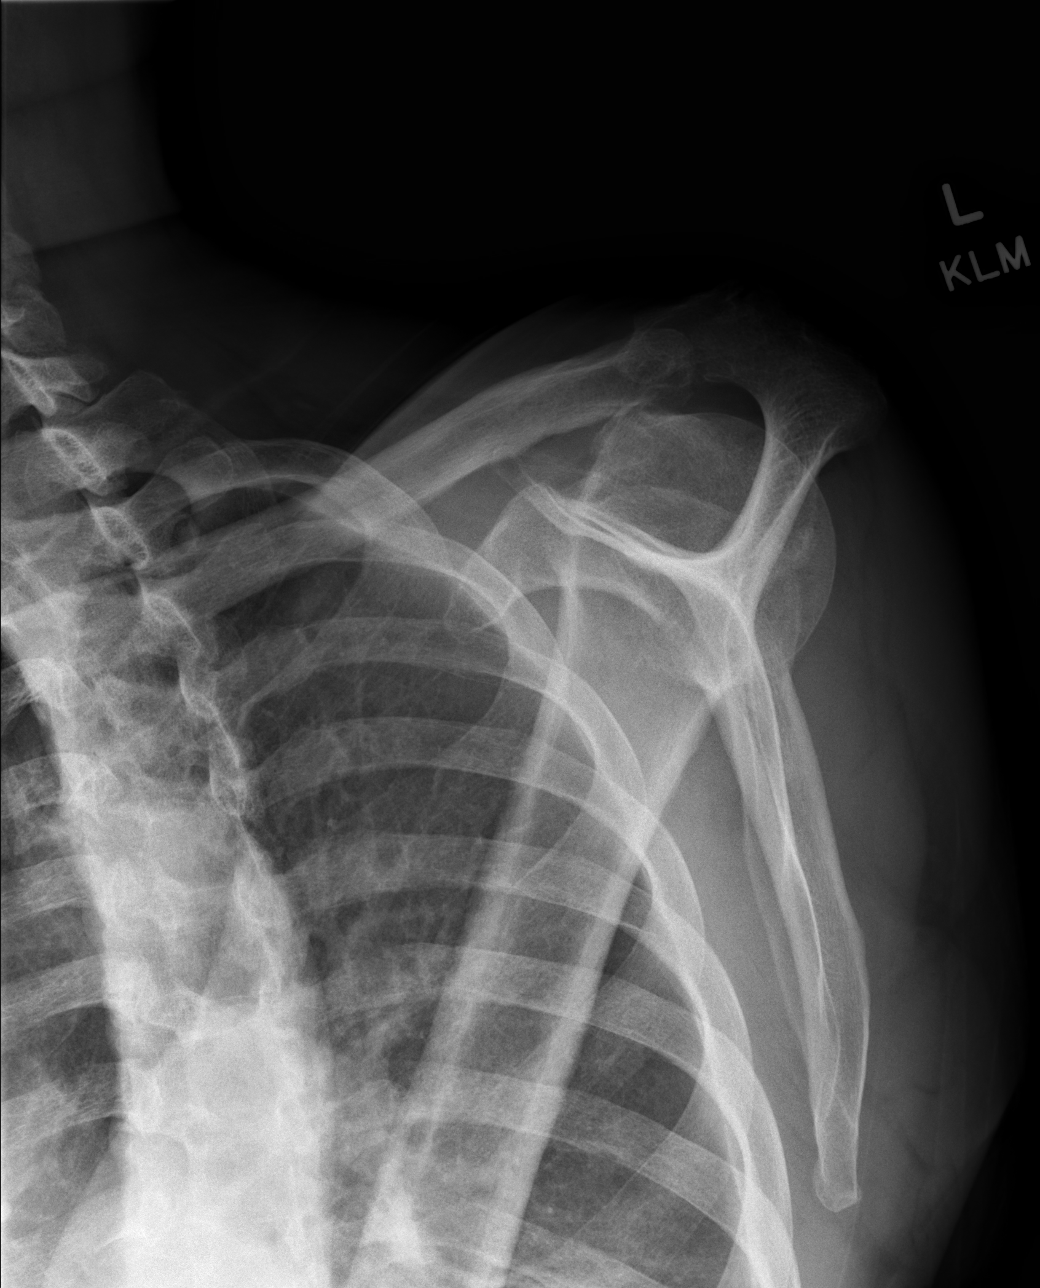

[x shoulder axillary left]
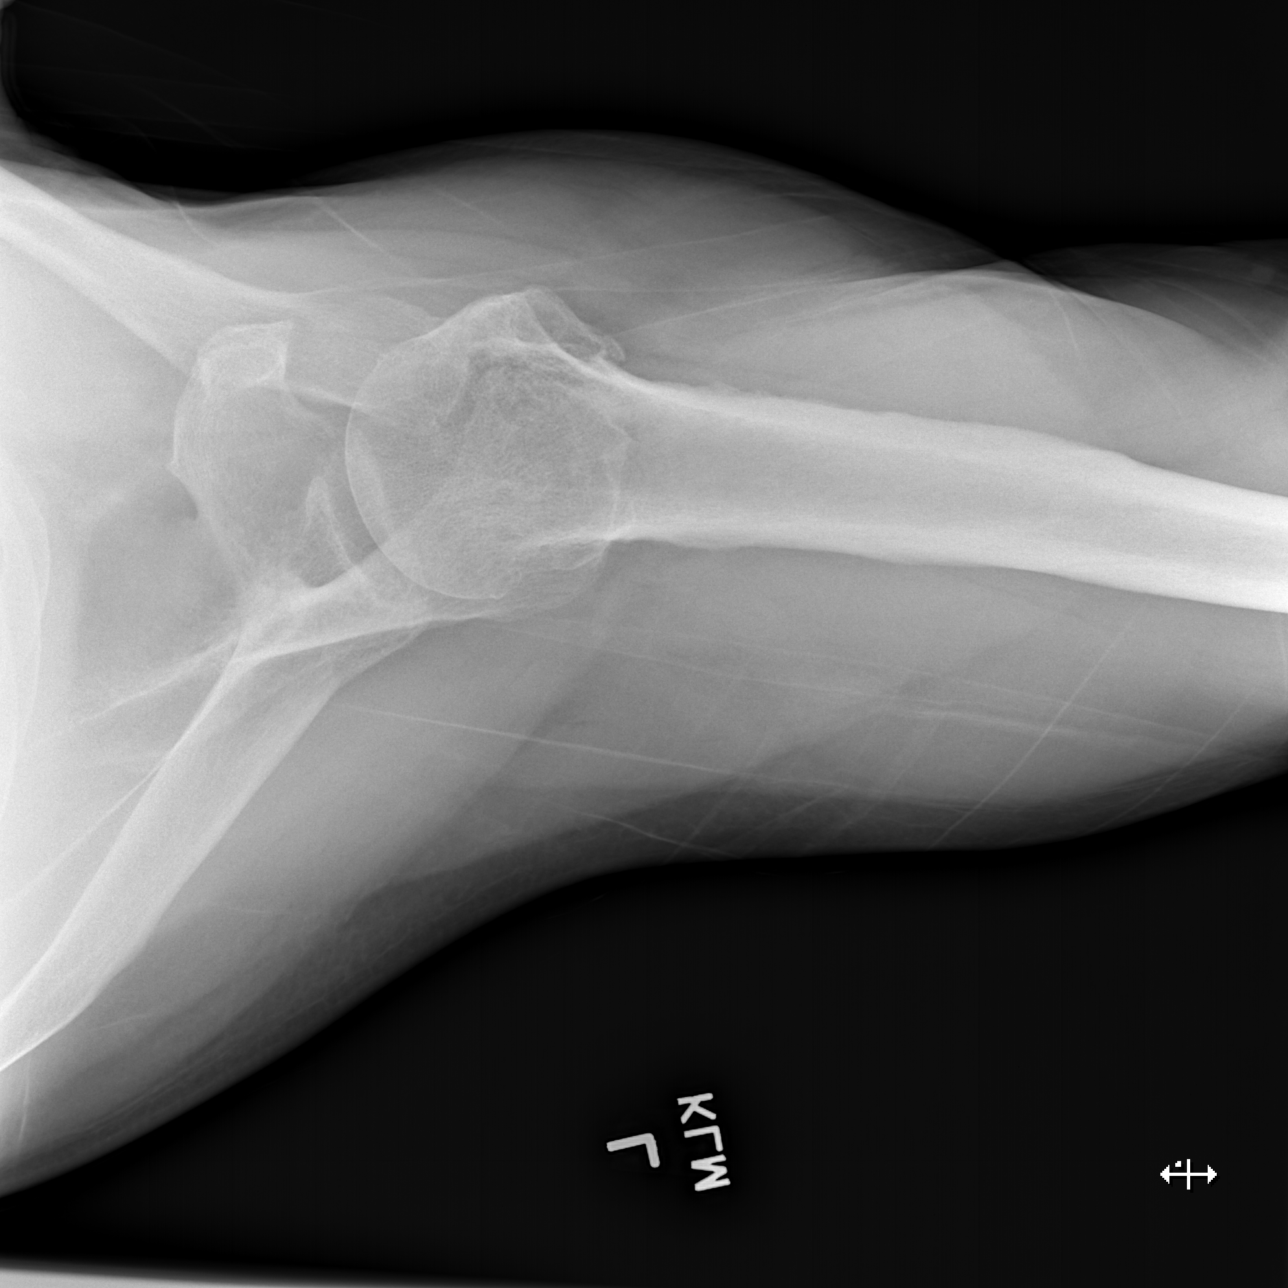

[3 of 3 positions shown; findings below may reference images not displayed]

FINDINGS: There is no evidence of fracture or dislocation. There is narrowing
and slight spurring of the AC joint. There is additional slight
spurring of the greater tuberosity. There is narrowing and mild
inferior spurring at the glenohumeral joint.

The acromiohumeral space is preserved. Soft tissues are
unremarkable.
IMPRESSION: Degenerative changes without evidence of fractures.

## 2023-05-26 ENCOUNTER — Other Ambulatory Visit: Payer: Self-pay | Admitting: Registered Nurse

## 2023-05-26 DIAGNOSIS — F17200 Nicotine dependence, unspecified, uncomplicated: Secondary | ICD-10-CM

## 2023-06-08 ENCOUNTER — Ambulatory Visit: Payer: Medicaid Other

## 2023-06-08 NOTE — Therapy (Incomplete)
OUTPATIENT PHYSICAL THERAPY LOWER EXTREMITY EVALUATION   Patient Name: Casey Howe MRN: [id number] DOB:[birth year], 58 y.o., male Today's Date: 06/08/2023  END OF SESSION:   Past Medical History:  Diagnosis Date   Cardiomyopathy (HCC)    Paresthesia of skin    No past surgical history on file. Patient Active Problem List   Diagnosis Date Noted   Anxiety 07/02/2021   Degenerative arthritis of left shoulder region 07/02/2021   Depression 07/02/2021   Left shoulder pain 06/26/2021   Cardiomyopathy (HCC) 04/20/2021   Alcohol use 04/19/2021   Pleural effusion 04/19/2021   Syncope 07/31/2015   Chronic alcoholism (HCC) 07/31/2015   Acute alcohol intoxication (HCC) 07/31/2015   Tobacco abuse 07/31/2015    PCP: Lewanda Rife, PA  REFERRING PROVIDER: Aurelio Brash, FNP   REFERRING DIAG: chronic osteoarthritis ,(bilateral ) shoulders .knees & right hip   THERAPY DIAG:  No diagnosis found.  Rationale for Evaluation and Treatment: Rehabilitation  ONSET DATE: Chronic  SUBJECTIVE:   SUBJECTIVE STATEMENT: ***  PERTINENT HISTORY: ***  PAIN:  Are you having pain?  Yes: NPRS scale: *** Pain location: *** Pain description: *** Aggravating factors: *** Relieving factors: ***  PRECAUTIONS: {Therapy precautions:24002}  RED FLAGS: {PT Red Flags:29287}   WEIGHT BEARING RESTRICTIONS: {Yes ***/No:24003}  FALLS:  Has patient fallen in last 6 months? {fallsyesno:27318}  LIVING ENVIRONMENT: Lives with: {[facility] lives with:25569::"lives with their family"} Lives in: {Lives in:25570} Stairs: {opstairs:27293} Has following equipment at home: {Assistive devices:23999}  OCCUPATION: ***  PLOF: {PLOF:24004}  PATIENT GOALS: ***  NEXT MD VISIT: ***  OBJECTIVE:  Note: Objective measures were completed at Evaluation unless otherwise noted.  DIAGNOSTIC FINDINGS: ***  PATIENT SURVEYS:  {rehab surveys:24030}  COGNITION: Overall cognitive status:  {cognition:24006}     SENSATION: {sensation:27233}  EDEMA:  {edema:24020}  MUSCLE LENGTH: Hamstrings: Right *** deg; Left *** deg Maisie Fus test: Right *** deg; Left *** deg  POSTURE: {posture:25561}  PALPATION: ***  UPPER EXTREMITY ROM:  {AROM/PROM:27142} ROM Right eval Left eval  Shoulder flexion    Shoulder extension    Shoulder abduction    Shoulder adduction    Shoulder extension    Shoulder internal rotation    Shoulder external rotation    Elbow flexion    Elbow extension    Wrist flexion    Wrist extension    Wrist ulnar deviation    Wrist radial deviation    Wrist pronation    Wrist supination     (Blank rows = not tested)  UPPER EXTREMITY MMT:  MMT Right eval Left eval  Shoulder flexion    Shoulder extension    Shoulder abduction    Shoulder adduction    Shoulder extension    Shoulder internal rotation    Shoulder external rotation    Middle trapezius    Lower trapezius    Elbow flexion    Elbow extension    Wrist flexion    Wrist extension    Wrist ulnar deviation    Wrist radial deviation    Wrist pronation    Wrist supination    Grip strength     (Blank rows = not tested)  LOWER EXTREMITY ROM:  {AROM/PROM:27142} ROM Right eval Left eval  Hip flexion    Hip extension    Hip abduction    Hip adduction    Hip internal rotation    Hip external rotation    Knee flexion    Knee extension    Ankle dorsiflexion  Ankle plantarflexion    Ankle inversion    Ankle eversion     (Blank rows = not tested)  LOWER EXTREMITY MMT:  MMT Right eval Left eval  Hip flexion    Hip extension    Hip abduction    Hip adduction    Hip internal rotation    Hip external rotation    Knee flexion    Knee extension    Ankle dorsiflexion    Ankle plantarflexion    Ankle inversion    Ankle eversion     (Blank rows = not tested)  LOWER EXTREMITY SPECIAL TESTS:  {LEspecialtests:26242}  FUNCTIONAL TESTS:  {Functional  tests:24029}  GAIT: Distance walked: *** Assistive device utilized: {Assistive devices:23999} Level of assistance: {Levels of assistance:24026} Comments: ***   TREATMENT: [facility] Adult PT Treatment:                                                  Therapeutic Exercise: *** Manual Therapy: *** Neuromuscular re-ed: *** Therapeutic Activity: *** Modalities: *** Self Care: ***  PATIENT EDUCATION:  Education details: *** Person educated: {Person educated:25204} Education method: {Education Method:25205} Education comprehension: {Education Comprehension:25206}  HOME EXERCISE PROGRAM: ***  ASSESSMENT:  CLINICAL IMPRESSION: Patient is a *** y.o. *** who was seen today for physical therapy evaluation and treatment for ***.   OBJECTIVE IMPAIRMENTS: {opptimpairments:25111}.   ACTIVITY LIMITATIONS: {activitylimitations:27494}  PARTICIPATION LIMITATIONS: {participationrestrictions:25113}  PERSONAL FACTORS: {Personal factors:25162} are also affecting patient's functional outcome.   REHAB POTENTIAL: {rehabpotential:25112}  CLINICAL DECISION MAKING: {clinical decision making:25114}  EVALUATION COMPLEXITY: {Evaluation complexity:25115}   GOALS: Goals reviewed with patient? {yes/no:20286}  SHORT TERM GOALS: Target date: *** *** Baseline: Goal status: INITIAL  2.  *** Baseline:  Goal status: INITIAL   LONG TERM GOALS: Target date: ***  *** Baseline:  Goal status: INITIAL  2.  *** Baseline:  Goal status: INITIAL  3.  *** Baseline:  Goal status: INITIAL  4.  *** Baseline:  Goal status: INITIAL  5.  *** Baseline:  Goal status: INITIAL  6.  *** Baseline:  Goal status: INITIAL   PLAN:  PT FREQUENCY: {rehab frequency:25116}  PT DURATION: {rehab duration:25117}  PLANNED INTERVENTIONS: {rehab planned interventions:25118::"97110-Therapeutic exercises","97530- Therapeutic [phone]- Neuromuscular re-education","97535- Self YNWG","95621- Manual  therapy"}  PLAN FOR NEXT SESSION: Eloy End, PT 06/08/2023, 7:51 AM

## 2023-11-05 ENCOUNTER — Other Ambulatory Visit: Payer: Self-pay

## 2023-11-05 ENCOUNTER — Emergency Department (HOSPITAL_COMMUNITY)
Admission: EM | Admit: 2023-11-05 | Discharge: 2023-11-05 | Disposition: A | Discharge status: Home / Self Care | Attending: Emergency Medicine | Admitting: Emergency Medicine

## 2023-11-05 ENCOUNTER — Emergency Department (HOSPITAL_COMMUNITY)

## 2023-11-05 ENCOUNTER — Encounter (HOSPITAL_COMMUNITY): Payer: Self-pay | Admitting: *Deleted

## 2023-11-05 DIAGNOSIS — J181 Lobar pneumonia, unspecified organism: Secondary | ICD-10-CM | POA: Insufficient documentation

## 2023-11-05 DIAGNOSIS — T671XXA Heat syncope, initial encounter: Secondary | ICD-10-CM | POA: Diagnosis present

## 2023-11-05 DIAGNOSIS — R0602 Shortness of breath: Secondary | ICD-10-CM | POA: Insufficient documentation

## 2023-11-05 DIAGNOSIS — J189 Pneumonia, unspecified organism: Secondary | ICD-10-CM

## 2023-11-05 DIAGNOSIS — W19XXXA Unspecified fall, initial encounter: Secondary | ICD-10-CM | POA: Insufficient documentation

## 2023-11-05 DIAGNOSIS — M25512 Pain in left shoulder: Secondary | ICD-10-CM | POA: Insufficient documentation

## 2023-11-05 LAB — CBC WITH DIFFERENTIAL/PLATELET
Abs Immature Granulocytes: 0.02 K/uL (ref 0.00–0.07)
Basophils Absolute: 0 K/uL (ref 0.0–0.1)
Basophils Relative: 1 %
Eosinophils Absolute: 0.2 K/uL (ref 0.0–0.5)
Eosinophils Relative: 3 %
HCT: 38.4 % — ABNORMAL LOW (ref 39.0–52.0)
Hemoglobin: 12.9 g/dL — ABNORMAL LOW (ref 13.0–17.0)
Immature Granulocytes: 0 %
Lymphocytes Relative: 25 %
Lymphs Abs: 1.3 K/uL (ref 0.7–4.0)
MCH: 32.5 pg (ref 26.0–34.0)
MCHC: 33.6 g/dL (ref 30.0–36.0)
MCV: 96.7 fL (ref 80.0–100.0)
Monocytes Absolute: 0.6 K/uL (ref 0.1–1.0)
Monocytes Relative: 12 %
Neutro Abs: 3 K/uL (ref 1.7–7.7)
Neutrophils Relative %: 59 %
Platelets: 134 K/uL — ABNORMAL LOW (ref 150–400)
RBC: 3.97 MIL/uL — ABNORMAL LOW (ref 4.22–5.81)
RDW: 13.2 % (ref 11.5–15.5)
WBC: 5.1 K/uL (ref 4.0–10.5)
nRBC: 0 % (ref 0.0–0.2)

## 2023-11-05 LAB — BRAIN NATRIURETIC PEPTIDE: B Natriuretic Peptide: 21.4 pg/mL (ref 0.0–100.0)

## 2023-11-05 LAB — COMPREHENSIVE METABOLIC PANEL WITH GFR
ALT: 23 U/L (ref 0–44)
AST: 41 U/L (ref 15–41)
Albumin: 3.6 g/dL (ref 3.5–5.0)
Alkaline Phosphatase: 52 U/L (ref 38–126)
Anion gap: 14 (ref 5–15)
BUN: 7 mg/dL (ref 6–20)
CO2: 16 mmol/L — ABNORMAL LOW (ref 22–32)
Calcium: 8.7 mg/dL — ABNORMAL LOW (ref 8.9–10.3)
Chloride: 102 mmol/L (ref 98–111)
Creatinine, Ser: 0.85 mg/dL (ref 0.61–1.24)
GFR, Estimated: >60 mL/min (ref >60)
Glucose, Bld: 96 mg/dL (ref 70–99)
Potassium: 4.2 mmol/L (ref 3.5–5.1)
Sodium: 132 mmol/L — ABNORMAL LOW (ref 135–145)
Total Bilirubin: 0.9 mg/dL (ref 0.0–1.2)
Total Protein: 6.7 g/dL (ref 6.5–8.1)

## 2023-11-05 LAB — TROPONIN I (HIGH SENSITIVITY): Troponin I (High Sensitivity): 4 ng/L (ref <18)

## 2023-11-05 MED ORDER — AMOXICILLIN-POT CLAVULANATE 875-125 MG PO TABS: 1.0000 | ORAL_TABLET | Freq: Two times a day (BID) | ORAL | 0 refills | Status: AC

## 2023-11-05 MED ORDER — AMOXICILLIN-POT CLAVULANATE 875-125 MG PO TABS
1.0000 | ORAL_TABLET | Freq: Once | ORAL | Status: AC
  Administered 2023-11-05: 1 via ORAL
  Filled 2023-11-05: qty 1

## 2023-11-05 MED ORDER — ACETAMINOPHEN 500 MG PO TABS
1000.0000 mg | ORAL_TABLET | Freq: Once | ORAL | Status: AC
  Administered 2023-11-05: 1000 mg via ORAL
  Filled 2023-11-05: qty 2

## 2023-11-05 NOTE — ED Provider Notes (Signed)
 [place] EMERGENCY DEPARTMENT AT [facility] Provider Note   CSN: [id number] Arrival date & time: 11/05/23  1353     Patient presents with: No chief complaint on file.   Casey Howe is a 58 y.o. male with PMHx cardiomyopathy, EtOH use disorder, anxiety, depression who presents to ED concern for syncopal episode.  Patient stated that he was standing outside on the sidewalk when he suddenly lost consciousness and collapsed.  Patient wondering if it was due to him standing outside in the hot sun.  Patient denies any alarming prodromal symptoms such as chest pain, headache.  Patient currently feels fine other than his left shoulder pain from his fall today.  Of note, patient complaining of other chronic symptoms.  He endorses SOB while he is trying to sleep at night and thinks it is due to his sleep apnea.  He also complains of intermittent chest congestion and coughing for the past 2 to 3 weeks.  Patient also complains of diarrheal episodes yesterday and today which is since resolved.  Denies chest pain, nausea, vomiting, abdominal pain.   HPI     Prior to Admission medications   Medication Sig Start Date End Date Taking? Authorizing Provider  amoxicillin -clavulanate (AUGMENTIN ) 875-125 MG tablet Take 1 tablet by mouth every 12 (twelve) hours for 7 days. 11/05/23 11/12/23 Yes Hildred Mollica, Nidia F, PA-C  acetaminophen  (TYLENOL ) 500 MG tablet Take 1,000 mg by mouth every 6 (six) hours as needed for moderate pain.    [provider]  albuterol  (VENTOLIN  HFA) 108 (90 Base) MCG/ACT inhaler Inhale 1 puff into the lungs every 6 (six) hours as needed for wheezing or shortness of breath. 01/18/23   Ruthell Lonni FALCON, PA-C  amitriptyline  (ELAVIL ) 25 MG tablet Take 1 tablet (25 mg total) by mouth at bedtime. 03/18/22   Penumalli, Vikram R, MD  benzonatate  (TESSALON ) 100 MG capsule Take 1 capsule (100 mg total) by mouth every 8 (eight) hours as needed for cough. 01/18/23   Ruthell Lonni FALCON, PA-C  celecoxib  (CELEBREX ) 200 MG capsule Take 1 capsule (200 mg total) by mouth 2 (two) times daily. 08/18/22   Jerri Kay HERO, MD  ciclopirox  (PENLAC ) 8 % solution Apply topically at bedtime. Apply over nail and surrounding skin. Apply daily over previous coat. After seven (7) days, may remove with alcohol and continue cycle. 04/21/22   McDonald, Juliene SAUNDERS, DPM  doxycycline  (VIBRAMYCIN ) 100 MG capsule Take 1 capsule (100 mg total) by mouth 2 (two) times daily. 05/21/22   Kommor, [place], MD  gabapentin (NEURONTIN) 300 MG capsule Take 300 mg by mouth 2 (two) times daily.    [provider]  ibuprofen (ADVIL) 800 MG tablet Take 800 mg by mouth every 6 (six) hours as needed. 03/18/22   [provider]  ketoconazole  (NIZORAL ) 2 % cream Apply 1 Application topically daily. 04/21/22   McDonald, Juliene SAUNDERS, DPM  Vitamin D, Ergocalciferol, (DRISDOL) 1.25 MG (50000 UNIT) CAPS capsule Take by mouth. 04/10/22   [provider]    Allergies: Lactose intolerance (gi)    Review of Systems  Neurological:  Positive for syncope.    Updated Vital Signs BP 124/73   Pulse 66   Temp 98.8 F (37.1 C)   Resp 16   Ht 5' 7 (1.702 m)   Wt 77.1 kg   SpO2 100%   BMI 26.63 kg/m   Physical Exam Vitals and nursing note reviewed.  Constitutional:      General: He is  not in acute distress.    Appearance: He is not ill-appearing or toxic-appearing.  HENT:     Head: Normocephalic.     Comments: Hematoma and very small (~1cm) laceration just above left eyebrow. No active bleeding.    Mouth/Throat:     Mouth: Mucous membranes are moist.  Eyes:     General: No scleral icterus.       Right eye: No discharge.        Left eye: No discharge.     Extraocular Movements: Extraocular movements intact.     Conjunctiva/sclera: Conjunctivae normal.     Pupils: Pupils are equal, round, and reactive to light.  Cardiovascular:     Rate and Rhythm: Normal rate and regular rhythm.     Pulses:  Normal pulses.     Heart sounds: Normal heart sounds. No murmur heard. Pulmonary:     Effort: Pulmonary effort is normal. No respiratory distress.     Breath sounds: Normal breath sounds. No wheezing, rhonchi or rales.  Abdominal:     General: Abdomen is flat. Bowel sounds are normal. There is no distension.     Palpations: Abdomen is soft. There is no mass.     Tenderness: There is no abdominal tenderness.  Musculoskeletal:     Right lower leg: No edema.     Left lower leg: No edema.     Comments: Left shoulder: ROM restricted due to pain.  +2 radial pulse.  Sensation light touch intact.  Area not tense.  Skin:    General: Skin is warm and dry.     Findings: No rash.  Neurological:     General: No focal deficit present.     Mental Status: He is alert and oriented to person, place, and time. Mental status is at baseline.     Comments: GCS 15. Speech is goal oriented. No deficits appreciated to CN III-XII; symmetric eyebrow raise, no facial drooping, tongue midline. Patient has equal grip strength bilaterally with 5/5 strength against resistance in all major muscle groups bilaterally. Sensation to light touch intact. Patient moves extremities without ataxia.   Psychiatric:        Mood and Affect: Mood normal.        Behavior: Behavior normal.     (all labs ordered are listed, but only abnormal results are displayed) Labs Reviewed  CBC WITH DIFFERENTIAL/PLATELET - Abnormal; Notable for the following components:      Result Value   RBC 3.97 (*)    Hemoglobin 12.9 (*)    HCT 38.4 (*)    Platelets 134 (*)    All other components within normal limits  COMPREHENSIVE METABOLIC PANEL WITH GFR - Abnormal; Notable for the following components:   Sodium 132 (*)    CO2 16 (*)    Calcium 8.7 (*)    All other components within normal limits  BRAIN NATRIURETIC PEPTIDE  URINALYSIS, ROUTINE W REFLEX MICROSCOPIC  CBG MONITORING, ED  TROPONIN I (HIGH SENSITIVITY)    EKG: EKG  Interpretation Date/Time:  Friday November 05 2023 14:20:22 EDT Ventricular Rate:  73 PR Interval:  242 QRS Duration:  98 QT Interval:  400 QTC Calculation: 440 R Axis:   71  Text Interpretation: Sinus rhythm with 1st degree A-V block Otherwise normal ECG When compared with ECG of 18-Jan-2023 09:49, PREVIOUS ECG IS PRESENT Confirmed by Mannie Pac [phone]) on 11/05/2023 7:42:45 PM  Radiology: [facility] Chest 2 View Result Date: 11/05/2023 CLINICAL DATA:  cough EXAM: CHEST - 2  VIEW COMPARISON:  None available. FINDINGS: The lateral radiograph is suboptimal due to patient's arm positioning. Patchy opacities in the retrocardiac left lower lobe, best visualized on the lateral radiograph. No pleural effusion or pneumothorax. Mild cardiomegaly. No acute fracture or destructive lesion. Multilevel thoracic osteophytosis. IMPRESSION: Patchy opacities in the retrocardiac left lower lobe, best visualized on the lateral radiograph, which may represent atelectasis or findings of a developing bronchopneumonia, in the correct clinical context. Electronically Signed   By: Rogelia Myers M.D.   On: 11/05/2023 19:16   CT Head Wo Contrast Result Date: 11/05/2023 CLINICAL DATA:  Trauma EXAM: CT HEAD WITHOUT CONTRAST CT CERVICAL SPINE WITHOUT CONTRAST TECHNIQUE: Multidetector CT imaging of the head and cervical spine was performed following the standard protocol without intravenous contrast. Multiplanar CT image reconstructions of the cervical spine were also generated. RADIATION DOSE REDUCTION: This exam was performed according to the departmental dose-optimization program which includes automated exposure control, adjustment of the mA and/or kV according to patient size and/or use of iterative reconstruction technique. COMPARISON:  CT brain and cervical spine 07/30/2015 FINDINGS: CT HEAD FINDINGS Brain: No acute territorial infarction, hemorrhage or intracranial mass. Mild atrophy. Mild patchy white matter hypodensity likely  chronic small vessel disease. Nonenlarged ventricles Vascular: No hyperdense vessels.  Carotid vascular calcification Skull: Normal. Negative for fracture or focal lesion. Sinuses/Orbits: Mild mucosal thickening in the sinuses Other: Moderate left forehead and supraorbital scalp hematoma and laceration. CT CERVICAL SPINE FINDINGS Alignment: Straightening of the cervical spine. No subluxation. Facet alignment is within normal limits. Skull base and vertebrae: No acute fracture. No primary bone lesion or focal pathologic process. Soft tissues and spinal canal: No prevertebral fluid or swelling. No visible canal hematoma. Disc levels: Moderate disc space narrowing and degenerative change C5-C6 and C6-C7. Multilevel facet degenerative changes. Diffuse multilevel right foraminal narrowing. Upper chest: Emphysema Other: None IMPRESSION: 1. No CT evidence for acute intracranial abnormality. Mild atrophy and chronic small vessel ischemic changes of the white matter. Moderate left forehead and supraorbital scalp hematoma and laceration. 2. Straightening of the cervical spine with multilevel degenerative changes. No acute osseous abnormality. Electronically Signed   By: Luke Bun M.D.   On: 11/05/2023 15:36   CT Cervical Spine Wo Contrast Result Date: 11/05/2023 CLINICAL DATA:  Trauma EXAM: CT HEAD WITHOUT CONTRAST CT CERVICAL SPINE WITHOUT CONTRAST TECHNIQUE: Multidetector CT imaging of the head and cervical spine was performed following the standard protocol without intravenous contrast. Multiplanar CT image reconstructions of the cervical spine were also generated. RADIATION DOSE REDUCTION: This exam was performed according to the departmental dose-optimization program which includes automated exposure control, adjustment of the mA and/or kV according to patient size and/or use of iterative reconstruction technique. COMPARISON:  CT brain and cervical spine 07/30/2015 FINDINGS: CT HEAD FINDINGS Brain: No acute  territorial infarction, hemorrhage or intracranial mass. Mild atrophy. Mild patchy white matter hypodensity likely chronic small vessel disease. Nonenlarged ventricles Vascular: No hyperdense vessels.  Carotid vascular calcification Skull: Normal. Negative for fracture or focal lesion. Sinuses/Orbits: Mild mucosal thickening in the sinuses Other: Moderate left forehead and supraorbital scalp hematoma and laceration. CT CERVICAL SPINE FINDINGS Alignment: Straightening of the cervical spine. No subluxation. Facet alignment is within normal limits. Skull base and vertebrae: No acute fracture. No primary bone lesion or focal pathologic process. Soft tissues and spinal canal: No prevertebral fluid or swelling. No visible canal hematoma. Disc levels: Moderate disc space narrowing and degenerative change C5-C6 and C6-C7. Multilevel facet degenerative changes. Diffuse multilevel right  foraminal narrowing. Upper chest: Emphysema Other: None IMPRESSION: 1. No CT evidence for acute intracranial abnormality. Mild atrophy and chronic small vessel ischemic changes of the white matter. Moderate left forehead and supraorbital scalp hematoma and laceration. 2. Straightening of the cervical spine with multilevel degenerative changes. No acute osseous abnormality. Electronically Signed   By: Luke Bun M.D.   On: 11/05/2023 15:36   DG Shoulder Left Result Date: 11/05/2023 CLINICAL DATA:  Pain after fall EXAM: LEFT SHOULDER - 3 VIEW COMPARISON:  X-ray 08/18/2022 left shoulder. Images. No report. Older exams as well FINDINGS: No fracture or dislocation. Small osteophytes identified. Slight joint space loss diffusely. Preserved bone mineralization. IMPRESSION: Mild degenerative changes. Electronically Signed   By: Ranell Bring M.D.   On: 11/05/2023 15:29     Procedures   Medications Ordered in the ED  amoxicillin -clavulanate (AUGMENTIN ) 875-125 MG per tablet 1 tablet (has no administration in time range)  acetaminophen   (TYLENOL ) tablet 1,000 mg (1,000 mg Oral Given 11/05/23 1853)                                    Medical Decision Making Amount and/or Complexity of Data Reviewed Labs: ordered. Radiology: ordered.  Risk OTC drugs. Prescription drug management.   This patient presents to the ED for concern of syncope, this involves an extensive number of treatment options, and is a complaint that carries with it a high risk of complications and morbidity.  The differential diagnosis includes CVA, ICH, intracranial mass, critical dehydration, endocrine abnormality, sepsis/infection, electrolyte abnormality, cardiac arrhythmia.   Co morbidities that complicate the patient evaluation  Cardiomyopathy, EtOH use disorder   Additional history obtained:  Dr. Christiana PCP Tdap booster 04/10/2022   Problem List / ED Course / Critical interventions / Medication management  Patient presents to ED concern for syncopal episode.  Denies any alarming prodromal symptoms.  States that it could have been due to standing outside in the hot sun for a long period of time.  Does endorse an intermittent chest congestion and cough over the past 2 to 3 weeks.  Also endorses SOB when he sleeps at night which he states is due to his sleep apnea.  Also endorses diarrheal episodes yesterday and today which is since resolved.  Physical exam reassuring.  Patient afebrile with stable vitals. I Ordered, and personally interpreted labs.  Troponin within normal limits.  BNP 21.4.  CBC without leukocytosis.  There is mild anemia with hemoglobin at 12.9.  CMP with mild hyponatremia 132.  CO2 mildly low at 16.  No anion gap. I ordered imaging studies including CT head/cervical spine, shoulder x-ray, chest x-ray. I independently visualized and interpreted imaging which showed possible developing PNA in left lower lobe - no other acute findings. I agree with the radiologist interpretation. PSI/PORT score 59 points - low risk. The patient  was maintained on a cardiac monitor.  I personally viewed and interpreted the EKG/cardiac monitored which showed an underlying rhythm of: Sinus rhythm Patient eating Jeanenne Novak in room and states that he has gotten up to walk around the use of bathroom without any gait abnormalities.  Patient stated that he feels fine other than his left shoulder pain.  I provide patient Tylenol  for his shoulder pain.  Educated patient alternating Advil and Tylenol  for his shoulder pain. Patient agrees to follow-up with PCP in the near future.  Patient also be given Ortho information for  his shoulder pain does not heal appropriately in the next 7 days. Patient tolerated his first dose of Augmentin  well in the ED for his developing pneumonia. I have reviewed the patients home medicines and have made adjustments as needed The patient has been appropriately medically screened and/or stabilized in the ED. I have low suspicion for any other emergent medical condition which would require further screening, evaluation or treatment in the ED or require inpatient management. At time of discharge the patient is hemodynamically stable and in no acute distress. I have discussed work-up results and diagnosis with patient and answered all questions. Patient is agreeable with discharge plan. We discussed strict return precautions for returning to the emergency department and they verbalized understanding.     Social Determinants of Health:  none       Final diagnoses:  Heat syncope, initial encounter  Pneumonia of left lower lobe due to infectious organism    ED Discharge Orders          Ordered    amoxicillin -clavulanate (AUGMENTIN ) 875-125 MG tablet  Every 12 hours        11/05/23 2001               Hoy Nidia FALCON, [place] 11/05/23 2003    Mannie Pac T, DO 11/05/23 2320

## 2023-11-05 NOTE — ED Provider Triage Note (Signed)
 Emergency Medicine Provider Triage Evaluation Note  Casey Howe , a 59 y.o. male  was evaluated in triage.  Pt complains of syncope.  Review of Systems  Positive:  Negative:   Physical Exam  There were no vitals taken for this visit. Gen:   Awake, no distress   Resp:  Normal effort  MSK:   Moves extremities without difficulty  Other:    Medical Decision Making  Medically screening exam initiated at 2:17 PM.  Appropriate orders placed.  Lynwood FORBES Hugger was informed that the remainder of the evaluation will be completed by another provider, this initial triage assessment does not replace that evaluation, and the importance of remaining in the ED until their evaluation is complete.  Patient is an extremely poor historian.  Patient stated that he was standing -and all of a sudden lost consciousness and woke up on the floor.  Called patient with hematoma on left eyebrow.  Patient also concern for left shoulder pain.  Patient denies recent chest pain, nausea, vomiting, diarrhea, abdominal pain.  Patient endorsing intermittent chest congestion and cough for the past 2 to 3 weeks.  Patient also endorsing SOB when he lies down to go to bed at night.  Patient states that this might be due to sleep apnea.   Hoy Nidia FALCON, [place] 11/05/23 1419

## 2023-11-05 NOTE — Discharge Instructions (Addendum)
 Please follow-up with your primary care provider in the next 48 to 72 hours.  Seek emergency care if experiencing any new or worsening symptoms.  Alternating between 650 mg Tylenol  and 400 mg Advil: The best way to alternate taking Acetaminophen  (example Tylenol ) and Ibuprofen (example Advil/Motrin) is to take them 3 hours apart. For example, if you take ibuprofen at 6 am you can then take Tylenol  at 9 am. You can continue this regimen throughout the day, making sure you do not exceed the recommended maximum dose for each drug.

## 2023-11-05 NOTE — ED Triage Notes (Signed)
 Pt coming in from home. Pt reports falling from sidewalk into parking lot. Pt endorses hitting head and has a hematoma over his left eye. Pt denies loc pt denies taking blood thinners.pt reports left shoulder pain. Pt has strong radial pulses.
